# Patient Record
Sex: Male | Born: 1960 | Race: White | Hispanic: No | Marital: Married | Smoking: Former smoker
Health system: Southern US, Community
[De-identification: ages and names within clinical notes are randomized; demographics above are authoritative.]

## PROBLEM LIST (undated history)

## (undated) DIAGNOSIS — E7800 Pure hypercholesterolemia, unspecified: Secondary | ICD-10-CM

## (undated) DIAGNOSIS — T7840XA Allergy, unspecified, initial encounter: Secondary | ICD-10-CM

## (undated) DIAGNOSIS — L309 Dermatitis, unspecified: Secondary | ICD-10-CM

## (undated) DIAGNOSIS — H269 Unspecified cataract: Secondary | ICD-10-CM

## (undated) DIAGNOSIS — M1990 Unspecified osteoarthritis, unspecified site: Secondary | ICD-10-CM

## (undated) DIAGNOSIS — J302 Other seasonal allergic rhinitis: Secondary | ICD-10-CM

## (undated) HISTORY — PX: APPENDECTOMY: SHX54

## (undated) HISTORY — DX: Unspecified cataract: H26.9

## (undated) HISTORY — PX: TONSILLECTOMY: SUR1361

## (undated) HISTORY — DX: Allergy, unspecified, initial encounter: T78.40XA

## (undated) HISTORY — PX: LASIK: SHX215

## (undated) HISTORY — DX: Unspecified osteoarthritis, unspecified site: M19.90

## (undated) HISTORY — PX: SHOULDER SURGERY: SHX246

## (undated) HISTORY — PX: NECK SURGERY: SHX720

---

## 2000-07-16 ENCOUNTER — Encounter: Payer: Self-pay | Admitting: Family Medicine

## 2000-07-16 ENCOUNTER — Encounter: Admission: RE | Admit: 2000-07-16 | Discharge: 2000-07-16 | Payer: Self-pay | Admitting: Family Medicine

## 2002-12-28 ENCOUNTER — Encounter: Payer: Self-pay | Admitting: Family Medicine

## 2002-12-28 ENCOUNTER — Encounter: Admission: RE | Admit: 2002-12-28 | Discharge: 2002-12-28 | Payer: Self-pay | Admitting: Family Medicine

## 2003-02-02 ENCOUNTER — Encounter: Payer: Self-pay | Admitting: Family Medicine

## 2003-02-02 ENCOUNTER — Encounter: Admission: RE | Admit: 2003-02-02 | Discharge: 2003-02-02 | Payer: Self-pay | Admitting: Family Medicine

## 2009-05-12 HISTORY — PX: SHOULDER SURGERY: SHX246

## 2010-05-09 ENCOUNTER — Ambulatory Visit
Admission: RE | Admit: 2010-05-09 | Discharge: 2010-05-10 | Payer: Self-pay | Source: Home / Self Care | Attending: Orthopedic Surgery | Admitting: Orthopedic Surgery

## 2010-05-12 HISTORY — PX: NECK SURGERY: SHX720

## 2010-07-22 LAB — POCT HEMOGLOBIN-HEMACUE: Hemoglobin: 14.3 g/dL (ref 13.0–17.0)

## 2011-04-24 ENCOUNTER — Encounter: Payer: Self-pay | Admitting: *Deleted

## 2011-04-24 ENCOUNTER — Emergency Department (HOSPITAL_COMMUNITY)
Admission: EM | Admit: 2011-04-24 | Discharge: 2011-04-24 | Disposition: A | Discharge status: Home / Self Care | Payer: 59 | Attending: Emergency Medicine | Admitting: Emergency Medicine

## 2011-04-24 ENCOUNTER — Emergency Department (HOSPITAL_COMMUNITY): Payer: 59

## 2011-04-24 DIAGNOSIS — E7800 Pure hypercholesterolemia, unspecified: Secondary | ICD-10-CM | POA: Insufficient documentation

## 2011-04-24 DIAGNOSIS — R059 Cough, unspecified: Secondary | ICD-10-CM | POA: Insufficient documentation

## 2011-04-24 DIAGNOSIS — R509 Fever, unspecified: Secondary | ICD-10-CM | POA: Insufficient documentation

## 2011-04-24 DIAGNOSIS — R1310 Dysphagia, unspecified: Secondary | ICD-10-CM | POA: Insufficient documentation

## 2011-04-24 DIAGNOSIS — R05 Cough: Secondary | ICD-10-CM | POA: Insufficient documentation

## 2011-04-24 LAB — URINALYSIS, ROUTINE W REFLEX MICROSCOPIC
Bilirubin Urine: NEGATIVE
Ketones, ur: NEGATIVE mg/dL
Leukocytes, UA: NEGATIVE
Nitrite: NEGATIVE
Protein, ur: NEGATIVE mg/dL
Urobilinogen, UA: 0.2 mg/dL (ref 0.0–1.0)

## 2011-04-24 LAB — DIFFERENTIAL
Basophils Absolute: 0 10*3/uL (ref 0.0–0.1)
Basophils Relative: 0 % (ref 0–1)
Eosinophils Absolute: 0.1 10*3/uL (ref 0.0–0.7)
Eosinophils Relative: 1 % (ref 0–5)
Lymphocytes Relative: 12 % (ref 12–46)
Lymphs Abs: 1.8 10*3/uL (ref 0.7–4.0)
Monocytes Absolute: 1.7 10*3/uL — ABNORMAL HIGH (ref 0.1–1.0)
Monocytes Relative: 11 % (ref 3–12)
Neutro Abs: 11.9 10*3/uL — ABNORMAL HIGH (ref 1.7–7.7)
Neutrophils Relative %: 77 % (ref 43–77)

## 2011-04-24 LAB — CBC
HCT: 37.4 % — ABNORMAL LOW (ref 39.0–52.0)
Hemoglobin: 12.6 g/dL — ABNORMAL LOW (ref 13.0–17.0)
MCH: 30.7 pg (ref 26.0–34.0)
MCHC: 33.7 g/dL (ref 30.0–36.0)
MCV: 91.2 fL (ref 78.0–100.0)
Platelets: 239 10*3/uL (ref 150–400)
RBC: 4.1 MIL/uL — ABNORMAL LOW (ref 4.22–5.81)
RDW: 13.1 % (ref 11.5–15.5)
WBC: 15.5 10*3/uL — ABNORMAL HIGH (ref 4.0–10.5)

## 2011-04-24 NOTE — ED Provider Notes (Signed)
History     CSN: [id number] Arrival date & time: 04/24/2011  5:40 PM   None     Chief Complaint  Patient presents with  . Fever    (Consider location/radiation/quality/duration/timing/severity/associated sxs/prior treatment) HPI Complains of fever onset yesterday accompanied by pain on swallowing and cough maximum temperature 101.2 no treatment prior to coming here no other complaint. No bowel pain no nausea no vomit. Cough nonproductive. No other associated symptoms. Patient status post cervical fusion 2 days ago History reviewed. No pertinent past medical history. Hypercholesterolemia Past Surgical History  Procedure Date  . Neck surgery   . Shoulder surgery    Rotator cuff repair; anterior cervical fusion No family history on file.  History  Substance Use Topics  . Smoking status: Not on file  . Smokeless tobacco: Not on file  . Alcohol Use: Yes      Review of Systems  Constitutional: Positive for fever.  HENT: Negative for neck stiffness.        Pain with swallowing  Respiratory: Positive for cough.   Cardiovascular: Negative.   Gastrointestinal: Negative.   Musculoskeletal: Negative.   Skin: Negative.   Neurological: Negative.   Hematological: Negative.   Psychiatric/Behavioral: Negative.   All other systems reviewed and are negative.    Allergies  Review of patient's allergies indicates no known allergies.  Home Medications  No current outpatient prescriptions on file.  BP 124/80  Pulse 101  Temp(Src) 100.2 F (37.9 C) (Oral)  Resp 20  SpO2 96%  Physical Exam  Constitutional: He appears well-developed and well-nourished. No distress.  HENT:  Head: Normocephalic and atraumatic.       Oropharynx normal  Eyes: Conjunctivae are normal. Pupils are equal, round, and reactive to light.       A lateral tympanic membranes normal  Neck: Neck supple. No tracheal deviation present. No thyromegaly present.       Well-healing surgical wound anteriorly.  Animal soft tissue swelling no drainage no tenderness  Cardiovascular: Normal rate and regular rhythm.   No murmur heard. Pulmonary/Chest: Effort normal and breath sounds normal.  Abdominal: Soft. Bowel sounds are normal. He exhibits no distension. There is no tenderness.  Musculoskeletal: Normal range of motion. He exhibits no edema and no tenderness.  Neurological: He is alert. Coordination normal.  Skin: Skin is warm and dry. No rash noted.  Psychiatric: He has a normal mood and affect.    ED Course  Procedures (including critical care time)805 pm resting comfortably. No disteress Results for orders placed during the hospital encounter of 04/24/11  CBC      Component Value Range   WBC 15.5 (*) 4.0 - 10.5 (K/uL)   RBC 4.10 (*) 4.22 - 5.81 (MIL/uL)   Hemoglobin 12.6 (*) 13.0 - 17.0 (g/dL)   HCT 40.9 (*) 81.1 - 52.0 (%)   MCV 91.2  78.0 - 100.0 (fL)   MCH 30.7  26.0 - 34.0 (pg)   MCHC 33.7  30.0 - 36.0 (g/dL)   RDW 91.4  78.2 - 95.6 (%)   Platelets 239  150 - 400 (K/uL)  DIFFERENTIAL      Component Value Range   Neutrophils Relative 77  43 - 77 (%)   Neutro Abs 11.9 (*) 1.7 - 7.7 (K/uL)   Lymphocytes Relative 12  12 - 46 (%)   Lymphs Abs 1.8  0.7 - 4.0 (K/uL)   Monocytes Relative 11  3 - 12 (%)   Monocytes Absolute 1.7 (*) 0.1 - 1.0 (K/uL)  Eosinophils Relative 1  0 - 5 (%)   Eosinophils Absolute 0.1  0.0 - 0.7 (K/uL)   Basophils Relative 0  0 - 1 (%)   Basophils Absolute 0.0  0.0 - 0.1 (K/uL)  URINALYSIS, ROUTINE W REFLEX MICROSCOPIC      Component Value Range   Color, Urine YELLOW  YELLOW    APPearance CLEAR  CLEAR    Specific Gravity, Urine 1.018  1.005 - 1.030    pH 6.0  5.0 - 8.0    Glucose, UA NEGATIVE  NEGATIVE (mg/dL)   Hgb urine dipstick NEGATIVE  NEGATIVE    Bilirubin Urine NEGATIVE  NEGATIVE    Ketones, ur NEGATIVE  NEGATIVE (mg/dL)   Protein, ur NEGATIVE  NEGATIVE (mg/dL)   Urobilinogen, UA 0.2  0.0 - 1.0 (mg/dL)   Nitrite NEGATIVE  NEGATIVE     Leukocytes, UA NEGATIVE  NEGATIVE    Dg Chest 2 View  04/24/2011  *RADIOLOGY REPORT*  Clinical Data: Cough.  Status post cervical fusion yesterday. Fever.  CHEST - 2 VIEW  Comparison: None.  Findings: The heart size is normal.  The lungs are clear.  The patient is status post cervical fusion at C5-6 and C6-7.  The axial skeleton is otherwise unremarkable.  IMPRESSION:  1.  Normal two-view chest. 2.  Status post lower cervical fusion.  Original Report Authenticated By: Jamesetta Orleans. MATTERN, M.D.     Labs Reviewed  CBC  DIFFERENTIAL   No results found.   No diagnosis found.    MDM  Discussed with Dr.Nudelman. CT neck not indicated. Patient has minimal pain and tenderness at site. Landon and sent to spirometry. Tylenol followup Dr. Newell Coral next week Diagnosis: postoperative fever        Doug Sou, MD 04/24/11 2014

## 2011-04-24 NOTE — ED Notes (Signed)
MD at bedside. 

## 2011-04-24 NOTE — ED Notes (Signed)
Patient transported to X-ray 

## 2011-04-24 NOTE — ED Notes (Signed)
Pt had neck surgery by Dr. Bettina Gavia yesterday and he was told to come here if he had a fever over 101.  Pt began having a fever last night, it was 101 at 20:30 last night.  Pt was afebrile this morning and began having increasing temp this afternoon and evening.  Pt wife spoke with RN at Dr. Brendia Sacks office and was told to come here.  Pt incision appears WNL, no drainage.  Pt has some reports some difficulty swallowing, no distress, no sob.  Pt was told to come here and have a "fever panel" drawn.

## 2012-02-27 ENCOUNTER — Encounter: Payer: Self-pay | Admitting: Internal Medicine

## 2012-04-05 ENCOUNTER — Encounter: Payer: Self-pay | Admitting: Internal Medicine

## 2012-04-05 ENCOUNTER — Ambulatory Visit (AMBULATORY_SURGERY_CENTER): Payer: 59

## 2012-04-05 VITALS — Ht 70.0 in | Wt 175.0 lb

## 2012-04-05 DIAGNOSIS — Z1211 Encounter for screening for malignant neoplasm of colon: Secondary | ICD-10-CM

## 2012-04-05 MED ORDER — MOVIPREP 100 G PO SOLR: 1.0000 | Freq: Once | ORAL | Status: DC

## 2012-04-27 ENCOUNTER — Encounter: Payer: Self-pay | Admitting: Internal Medicine

## 2012-04-27 ENCOUNTER — Ambulatory Visit (AMBULATORY_SURGERY_CENTER): Payer: 59 | Admitting: Internal Medicine

## 2012-04-27 VITALS — BP 120/89 | HR 71 | Temp 98.0°F | Resp 15 | Ht 70.0 in | Wt 175.0 lb

## 2012-04-27 DIAGNOSIS — D126 Benign neoplasm of colon, unspecified: Secondary | ICD-10-CM

## 2012-04-27 DIAGNOSIS — Z1211 Encounter for screening for malignant neoplasm of colon: Secondary | ICD-10-CM

## 2012-04-27 MED ORDER — SODIUM CHLORIDE 0.9 % IV SOLN: 500.0000 mL | INTRAVENOUS | Status: DC

## 2012-04-27 NOTE — Progress Notes (Signed)
Called to room to assist during endoscopic procedure.  Patient ID and intended procedure confirmed with present staff. Received instructions for my participation in the procedure from the performing physician.  

## 2012-04-27 NOTE — Op Note (Signed)
[facility] [address] [place], [address]   COLONOSCOPY PROCEDURE REPORT  PATIENT: [name], [name]  MR#: [id number] BIRTHDATE: 01-Jul-1960 , 51  yrs. old GENDER: Male ENDOSCOPIST: Roxy Cedar, MD REFERRED XB:[id number] Avva, M.D. PROCEDURE DATE:  04/27/2012 PROCEDURE:   Colonoscopy with snare polypectomy   x 1 ASA CLASS:   Class II INDICATIONS:average risk screening. MEDICATIONS: MAC sedation, administered by CRNA and propofol (Diprivan) 350mg  IV  DESCRIPTION OF PROCEDURE:   After the risks benefits and alternatives of the procedure were thoroughly explained, informed consent was obtained.  A digital rectal exam revealed no abnormalities of the rectum.   The LB [id number]  endoscope was introduced through the anus and advanced to the cecum, which was identified by both the appendix and ileocecal valve. No adverse events experienced.   The quality of the prep was excellent, using MoviPrep  The instrument was then slowly withdrawn as the colon was fully examined.      COLON FINDINGS: A diminutive polyp was found in the transverse colon.  A polypectomy was performed with a cold snare.  The resection was complete and the polyp tissue was completely retrieved.   Moderate diverticulosis was noted in the descending colon and sigmoid colon.   The colon mucosa was otherwise normal. Retroflexed views revealed internal hemorrhoids. The time to cecum=2 minutes 34 seconds.  Withdrawal time=13 minutes 38 seconds. The scope was withdrawn and the procedure completed. COMPLICATIONS: There were no complications.  ENDOSCOPIC IMPRESSION: 1.   Diminutive polyp was found in the transverse colon; polypectomy was performed with a cold snare 2.   Moderate diverticulosis was noted in the descending colon and sigmoid colon 3.   The colon mucosa was otherwise normal  RECOMMENDATIONS: 1. Repeat colonoscopy in 5 years if polyp adenomatous; otherwise  10 years   eSigned:  Roxy Cedar, MD 04/27/2012 12:34 PM   cc: Chilton Greathouse, MD and The Patient   PATIENT NAME:  [name], [name] MR#: [id number]

## 2012-04-27 NOTE — Progress Notes (Signed)
Patient did not experience any of the following events: a burn prior to discharge; a fall within the facility; wrong site/side/patient/procedure/implant event; or a hospital transfer or hospital admission upon discharge from the facility. (G8907) Patient did not have preoperative order for IV antibiotic SSI prophylaxis. (G8918)  

## 2012-04-27 NOTE — Patient Instructions (Addendum)
Impressions/recommendations:  Polyp (handout given) Diverticulosis (handout given) High fiber diet (handout given)  Repeat colonoscopy pending pathology results.  YOU HAD AN ENDOSCOPIC PROCEDURE TODAY AT THE [facility]: Refer to the procedure report that was given to you for any specific questions about what was found during the examination.  If the procedure report does not answer your questions, please call your gastroenterologist to clarify.  If you requested that your care partner not be given the details of your procedure findings, then the procedure report has been included in a sealed envelope for you to review at your convenience later.  YOU SHOULD EXPECT: Some feelings of bloating in the abdomen. Passage of more gas than usual.  Walking can help get rid of the air that was put into your GI tract during the procedure and reduce the bloating. If you had a lower endoscopy (such as a colonoscopy or flexible sigmoidoscopy) you may notice spotting of blood in your stool or on the toilet paper. If you underwent a bowel prep for your procedure, then you may not have a normal bowel movement for a few days.  DIET: Your first meal following the procedure should be a light meal and then it is ok to progress to your normal diet.  A half-sandwich or bowl of soup is an example of a good first meal.  Heavy or fried foods are harder to digest and may make you feel nauseous or bloated.  Likewise meals heavy in dairy and vegetables can cause extra gas to form and this can also increase the bloating.  Drink plenty of fluids but you should avoid alcoholic beverages for 24 hours.  ACTIVITY: Your care partner should take you home directly after the procedure.  You should plan to take it easy, moving slowly for the rest of the day.  You can resume normal activity the day after the procedure however you should NOT DRIVE or use heavy machinery for 24 hours (because of the sedation medicines used during  the test).    SYMPTOMS TO REPORT IMMEDIATELY: A gastroenterologist can be reached at any hour.  During normal business hours, 8:30 AM to 5:00 PM Monday through Friday, call [phone].  After hours and on weekends, please call the GI answering service at [phone] who will take a message and have the physician on call contact you.   Following lower endoscopy (colonoscopy or flexible sigmoidoscopy):  Excessive amounts of blood in the stool  Significant tenderness or worsening of abdominal pains  Swelling of the abdomen that is new, acute  Fever of 100F or higher  FOLLOW UP: If any biopsies were taken you will be contacted by phone or by letter within the next 1-3 weeks.  Call your gastroenterologist if you have not heard about the biopsies in 3 weeks.  Our staff will call the home number listed on your records the next business day following your procedure to check on you and address any questions or concerns that you may have at that time regarding the information given to you following your procedure. This is a courtesy call and so if there is no answer at the home number and we have not heard from you through the emergency physician on call, we will assume that you have returned to your regular daily activities without incident.  SIGNATURES/CONFIDENTIALITY: You and/or your care partner have signed paperwork which will be entered into your electronic medical record.  These signatures attest to the fact that that the information   above on your After Visit Summary has been reviewed and is understood.  Full responsibility of the confidentiality of this discharge information lies with you and/or your care-partner. 

## 2012-04-28 ENCOUNTER — Telehealth: Payer: Self-pay

## 2012-04-28 NOTE — Telephone Encounter (Signed)
  Follow up Call-  Call back number 04/27/2012  Post procedure Call Back phone  # [phone]  Permission to leave phone message Yes     Patient questions:  Do you have a fever, pain , or abdominal swelling? no Pain Score  0 *  Have you tolerated food without any problems? yes  Have you been able to return to your normal activities? yes  Do you have any questions about your discharge instructions: Diet   no Medications  no Follow up visit  no  Do you have questions or concerns about your Care? no  Actions: * If pain score is 4 or above: No action needed, pain <4.

## 2012-05-03 ENCOUNTER — Encounter: Payer: Self-pay | Admitting: Internal Medicine

## 2020-03-07 ENCOUNTER — Ambulatory Visit: Payer: BC Managed Care – PPO | Admitting: Orthopedic Surgery

## 2020-03-07 ENCOUNTER — Ambulatory Visit: Payer: Self-pay

## 2020-03-07 DIAGNOSIS — M25511 Pain in right shoulder: Secondary | ICD-10-CM

## 2020-03-10 NOTE — Progress Notes (Signed)
Office Visit Note   Patient: Carlos Arnold           Date of Birth: [birth year]           MRN: [id number] Visit Date: 03/07/2020 Requested by: Prince Solian, MD [address] [place],  [place] [address] PCP: Prince Solian, MD  Subjective: Chief Complaint  Patient presents with  . Right Shoulder - Pain    HPI: Carlos Arnold is a 59 y.o. male who presents to the office complaining of right shoulder pain for the last year.  Patient denies any acute injury.  He complains of anterior pain without radiation.  Pain is worse with lifting.  He has tried Aleve and Advil without significant relief.  He had a left shoulder rotator cuff repair 10 years ago.  Notes his symptoms today feel similar to this.  He has difficulty lifting the arm at times.  He has had to stop lifting weights in the last month and since then pain has been improving but he is afraid the pain will return when he goes back to his activities.  He wakes with pain on occasion.  He notes no neck pain, radicular pain, significant numbness/tingling.  He has history of prior neck surgery about 9 years ago.  He notes that he is very active and recently when he went fly fishing several weeks ago flicking his arm from side to side severely exacerbated his pain for couple days before improving with rest..                ROS: All systems reviewed are negative as they relate to the chief complaint within the history of present illness.  Patient denies fevers or chills.  Assessment & Plan: Visit Diagnoses:  1. Right shoulder pain, unspecified chronicity     Plan: Patient is a 59 year old male presents complaint of right shoulder pain.  He has had right shoulder pain for 1 year without injury.  He has difficulty lifting his arm at times as well as pain that is exacerbated with activity.  He has stopped lifting weights over the last month and pain is improved.  No frank weakness on exam but he does have some anterior crepitus that is  suspicious for rotator cuff injury.  Excellent range of motion.  Discussed options available to patient.  He wishes to hold off on any treatment for now and see how his pain changes as he works back into his activity level.  If his pain significantly worsens back to how it was several weeks to months ago, he will call the office and we will arrange for MRI arthrogram of the right shoulder for further evaluation.  Radiographs taken today of the right shoulder are negative for any acute pathology to explain his pain.  Follow-up as needed.  Follow-Up Instructions: No follow-ups on file.   Orders:  Orders Placed This Encounter  Procedures  . XR Shoulder Right   No orders of the defined types were placed in this encounter.     Procedures: No procedures performed   Clinical Data: No additional findings.  Objective: Vital Signs: There were no vitals taken for this visit.  Physical Exam:  Constitutional: Patient appears well-developed HEENT:  Head: Normocephalic Eyes:EOM are normal Neck: Normal range of motion Cardiovascular: Normal rate Pulmonary/chest: Effort normal Neurologic: Patient is alert Skin: Skin is warm Psychiatric: Patient has normal mood and affect  Ortho Exam: Ortho exam demonstrates right shoulder with 70 degrees external rotation, 115 degrees abduction,  160 degrees forward flexion.  Positive O'Brien sign.  Excellent strength of rotator cuff.  Anterior crepitus is felt with passive range of motion shoulder.  5/5 motor strength of the bilateral grip strength, finger abduction, pronation/supination, bicep, tricep, deltoid. Sensation intact through all dermatomes of bilateral upper extremities.  No tenderness over the axial cervical spine.  No pain with cervical spine range of motion.  Specialty Comments:  No specialty comments available.  Imaging: No results found.   PMFS History: There are no problems to display for this patient.  No past medical history on  file.  Family History  Problem Relation Age of Onset  . Colon cancer Neg Hx     Past Surgical History:  Procedure Laterality Date  . NECK SURGERY    . SHOULDER SURGERY     Social History   Occupational History  . Not on file  Tobacco Use  . Smoking status: Former Smoker    Quit date: 04/05/1982    Years since quitting: 37.9  . Smokeless tobacco: Never Used  Substance and Sexual Activity  . Alcohol use: Yes    Alcohol/week: 3.0 standard drinks    Types: 3 Glasses of wine per week  . Drug use: No  . Sexual activity: Not on file

## 2020-03-11 ENCOUNTER — Encounter: Payer: Self-pay | Admitting: Orthopedic Surgery

## 2021-04-03 ENCOUNTER — Other Ambulatory Visit: Payer: Self-pay | Admitting: Internal Medicine

## 2021-04-03 DIAGNOSIS — M5412 Radiculopathy, cervical region: Secondary | ICD-10-CM

## 2021-04-03 DIAGNOSIS — M25512 Pain in left shoulder: Secondary | ICD-10-CM

## 2021-05-16 ENCOUNTER — Ambulatory Visit
Admission: RE | Admit: 2021-05-16 | Discharge: 2021-05-16 | Disposition: A | Discharge status: Home / Self Care | Payer: BC Managed Care – PPO | Source: Ambulatory Visit | Attending: Internal Medicine | Admitting: Internal Medicine

## 2021-05-16 ENCOUNTER — Other Ambulatory Visit: Payer: Self-pay

## 2021-05-16 DIAGNOSIS — M25512 Pain in left shoulder: Secondary | ICD-10-CM

## 2021-05-16 DIAGNOSIS — M5412 Radiculopathy, cervical region: Secondary | ICD-10-CM

## 2021-05-16 MED ORDER — GADOBENATE DIMEGLUMINE 529 MG/ML IV SOLN
15.0000 mL | Freq: Once | INTRAVENOUS | Status: AC | PRN
  Administered 2021-05-16: 15 mL via INTRAVENOUS

## 2021-07-29 ENCOUNTER — Other Ambulatory Visit: Payer: Self-pay | Admitting: Internal Medicine

## 2021-07-29 DIAGNOSIS — R748 Abnormal levels of other serum enzymes: Secondary | ICD-10-CM

## 2021-08-21 ENCOUNTER — Ambulatory Visit
Admission: RE | Admit: 2021-08-21 | Discharge: 2021-08-21 | Disposition: A | Discharge status: Home / Self Care | Payer: BC Managed Care – PPO | Source: Ambulatory Visit | Attending: Internal Medicine | Admitting: Internal Medicine

## 2021-08-21 DIAGNOSIS — R748 Abnormal levels of other serum enzymes: Secondary | ICD-10-CM

## 2022-03-07 ENCOUNTER — Encounter: Payer: Self-pay | Admitting: Internal Medicine

## 2022-03-12 ENCOUNTER — Ambulatory Visit (INDEPENDENT_AMBULATORY_CARE_PROVIDER_SITE_OTHER): Payer: BC Managed Care – PPO

## 2022-03-12 ENCOUNTER — Encounter: Payer: Self-pay | Admitting: Orthopedic Surgery

## 2022-03-12 ENCOUNTER — Ambulatory Visit: Payer: BC Managed Care – PPO | Admitting: Orthopedic Surgery

## 2022-03-12 DIAGNOSIS — M25511 Pain in right shoulder: Secondary | ICD-10-CM

## 2022-03-12 NOTE — Progress Notes (Cosign Needed Addendum)
Office Visit Note   Patient: Carlos Arnold           Date of Birth: 03/02/1961           MRN: [id number] Visit Date: 03/12/2022 Requested by: Prince Solian, MD [address] [place],   [address] PCP: Prince Solian, MD  Subjective: Chief Complaint  Patient presents with   Right Shoulder - Pain    HPI: Carlos Arnold is a 61 y.o. male who presents to the office reporting right shoulder pain.  Patient states he has had intermittent right shoulder pain for several years on and off but has been more consistent for the last year.  Localizes pain to the anterior aspect of the shoulder without radiation.  He states that is progressed to the point where he has had to stop working out over the last several months due to the increased pain.  Mostly has pain with overhead dumbbell press, dumbbell flies, push-ups.  Pain will not really wake him from deep sleep but makes it difficult falling asleep sometimes.  He has difficulty laying on his right shoulder when the pain flares up.  Has a slight noise with range of motion of the shoulder.  No history of prior right shoulder surgery.  Does have history of left rotator cuff tear repair in 2011.  Also has history of cervical spine surgery by Dr. Sherwood Gambler in 2012.  Sees Dr. Ellene Route who feels that his neck pain is related to the left shoulder pain he has but not related to the right shoulder pain.  No history of diabetes, smoking, blood thinner use.  Taking Advil daily every morning.  No scapular pain.  He is planning on having C-spine ESI set up by Dr. Ellene Route..                ROS: All systems reviewed are negative as they relate to the chief complaint within the history of present illness.  Patient denies fevers or chills.  Assessment & Plan: Visit Diagnoses:  1. Right shoulder pain, unspecified chronicity     Plan: Patient is a 61 year old male who presents for evaluation of right shoulder pain.  He has had shoulder pain for several years  with last office visit in 2021.  He has persistent pain intermittently since then.  With asymmetric subscap weakness and Popeye deformity, new compared to last visit in 2021, ultrasound was applied to the shoulder today demonstrating no bicep tendon noted in the bicipital groove and at least partial thickness tearing of the supraspinatus noted with hypoechoic signal in the tendon.  With chronic bicep tendon rupture and identifiable signal changes of the rotator cuff tendon on ultrasound today, need MRI arthrogram of the right shoulder for further evaluation of the extent of rotator cuff damage.  Follow-up after MRI to review results.  Radiographs of the right shoulder were taken today demonstrated no acute abnormality.  Follow-Up Instructions: No follow-ups on file.   Orders:  Orders Placed This Encounter  Procedures   XR Shoulder Right   MR SHOULDER RIGHT W CONTRAST   Arthrogram   No orders of the defined types were placed in this encounter.     Procedures: No procedures performed   Clinical Data: No additional findings.  Objective: Vital Signs: There were no vitals taken for this visit.  Physical Exam:  Constitutional: Patient appears well-developed HEENT:  Head: Normocephalic Eyes:EOM are normal Neck: Normal range of motion Cardiovascular: Normal rate Pulmonary/chest: Effort normal Neurologic: Patient is alert Skin:  Skin is warm Psychiatric: Patient has normal mood and affect  Ortho Exam: Ortho exam demonstrates right shoulder with 60 degrees external rotation, 100 degrees abduction, 160 degrees forward flexion.  This compared with left shoulder with 40 degrees external rotation, 110 degrees abduction, 170 degrees forward flexion.  He has 5/5 motor strength of supraspinatus and infraspinatus of the right shoulder with 5 -/5 subscapularis strength.  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  No tenderness over  the [facility] joint.  There is crepitus noted with passive motion of the right shoulder and 90 degrees of abduction that is mostly localized to the anterior and anterior lateral aspect of the right shoulder.  Axillary nerve intact with deltoid firing.  Intact EPL, FPL, finger abduction, finger adduction, pronation/supination, bicep, tricep, deltoid.  Positive liftoff sign on right, negative on left.  Negative belly press sign bilaterally.  Negative bearhug sign but does reproduce pain on the right.  Specialty Comments:  No specialty comments available.  Imaging: No results found.   PMFS History: There are no problems to display for this patient.  No past medical history on file.  Family History  Problem Relation Age of Onset   Colon cancer Neg Hx     Past Surgical History:  Procedure Laterality Date   NECK SURGERY     SHOULDER SURGERY     Social History   Occupational History   Not on file  Tobacco Use   Smoking status: Former    Types: Cigarettes    Quit date: 04/05/1982    Years since quitting: 39.9   Smokeless tobacco: Never  Substance and Sexual Activity   Alcohol use: Yes    Alcohol/week: 3.0 standard drinks of alcohol    Types: 3 Glasses of wine per week   Drug use: No   Sexual activity: Not on file

## 2022-03-13 ENCOUNTER — Encounter: Payer: Self-pay | Admitting: Internal Medicine

## 2022-04-02 ENCOUNTER — Ambulatory Visit
Admission: RE | Admit: 2022-04-02 | Discharge: 2022-04-02 | Disposition: A | Discharge status: Home / Self Care | Payer: BC Managed Care – PPO | Source: Ambulatory Visit | Attending: Orthopedic Surgery | Admitting: Orthopedic Surgery

## 2022-04-02 DIAGNOSIS — M25511 Pain in right shoulder: Secondary | ICD-10-CM

## 2022-04-02 MED ORDER — IOPAMIDOL (ISOVUE-M 200) INJECTION 41%
12.0000 mL | Freq: Once | INTRAMUSCULAR | Status: AC
  Administered 2022-04-02: 12 mL via INTRA_ARTICULAR

## 2022-04-07 ENCOUNTER — Encounter: Payer: Self-pay | Admitting: Orthopedic Surgery

## 2022-04-07 ENCOUNTER — Ambulatory Visit: Payer: BC Managed Care – PPO | Admitting: Orthopedic Surgery

## 2022-04-07 DIAGNOSIS — M75121 Complete rotator cuff tear or rupture of right shoulder, not specified as traumatic: Secondary | ICD-10-CM

## 2022-04-07 NOTE — Progress Notes (Unsigned)
   Office Visit Note   Patient: Carlos Arnold           Date of Birth: 20-Nov-1960           MRN: [id number] Visit Date: 04/07/2022 Requested by: Prince Solian, MD [address] [place],  [place] [address] PCP: Prince Solian, MD  Subjective: Chief Complaint  Patient presents with  . Right Shoulder - Pain, Follow-up    MRI review    HPI: Carlos Arnold is a 61 y.o. male who presents to the office reporting ***.                ROS: All systems reviewed are negative as they relate to the chief complaint within the history of present illness.  Patient denies fevers or chills.  Assessment & Plan: Visit Diagnoses: No diagnosis found.  Plan: ***  Follow-Up Instructions: No follow-ups on file.   Orders:  No orders of the defined types were placed in this encounter.  No orders of the defined types were placed in this encounter.     Procedures: No procedures performed   Clinical Data: No additional findings.  Objective: Vital Signs: There were no vitals taken for this visit.  Physical Exam:  Constitutional: Patient appears well-developed HEENT:  Head: Normocephalic Eyes:EOM are normal Neck: Normal range of motion Cardiovascular: Normal rate Pulmonary/chest: Effort normal Neurologic: Patient is alert Skin: Skin is warm Psychiatric: Patient has normal mood and affect  Ortho Exam: ***  Specialty Comments:  No specialty comments available.  Imaging: No results found.   PMFS History: There are no problems to display for this patient.  History reviewed. No pertinent past medical history.  Family History  Problem Relation Age of Onset  . Colon cancer Neg Hx     Past Surgical History:  Procedure Laterality Date  . NECK SURGERY    . SHOULDER SURGERY     Social History   Occupational History  . Not on file  Tobacco Use  . Smoking status: Former    Types: Cigarettes    Quit date: 04/05/1982    Years since quitting: 40.0  . Smokeless tobacco:  Never  Substance and Sexual Activity  . Alcohol use: Yes    Alcohol/week: 3.0 standard drinks of alcohol    Types: 3 Glasses of wine per week  . Drug use: No  . Sexual activity: Not on file

## 2022-04-08 ENCOUNTER — Encounter: Payer: Self-pay | Admitting: Orthopedic Surgery

## 2022-04-25 ENCOUNTER — Other Ambulatory Visit (HOSPITAL_COMMUNITY): Payer: BC Managed Care – PPO

## 2022-04-25 NOTE — Progress Notes (Signed)
Surgical Instructions    Your procedure is scheduled on Tuesday, 04/29/22.  Report to [facility] Main Entrance "A" at 4:00 P.M., then check in with the Admitting office.  Call this number if you have problems the morning of surgery:  [phone]   If you have any questions prior to your surgery date call [phone]: Open Monday-Friday 8am-4pm If you experience any cold or flu symptoms such as cough, fever, chills, shortness of breath, etc. between now and your scheduled surgery, please notify us at the above number     Remember:  Do not eat after midnight the night before your surgery  You may drink clear liquids until 3:00PM the day of your surgery.   Clear liquids allowed are: Water, Non-Citrus Juices (without pulp), Carbonated Beverages, Clear Tea, Black Coffee ONLY (NO MILK, CREAM OR POWDERED CREAMER of any kind), and Gatorade    Take these medicines the morning of surgery with A SIP OF WATER:  atorvastatin (LIPITOR)  fexofenadine (ALLEGRA)   As of today, STOP taking any Aspirin (unless otherwise instructed by your surgeon) Aleve, Naproxen, Ibuprofen, Motrin, Advil, Goody's, BC's, all herbal medications, fish oil, and all vitamins.           Do not wear jewelry or makeup. Do not wear lotions, powders, cologne or deodorant. Men may shave face and neck. Do not bring valuables to the hospital. Do not wear nail polish, gel polish, artificial nails, or any other type of covering on natural nails (fingers and toes) If you have artificial nails or gel coating that need to be removed by a nail salon, please have this removed prior to surgery. Artificial nails or gel coating may interfere with anesthesia's ability to adequately monitor your vital signs.  [facility] is not responsible for any belongings or valuables.    Do NOT Smoke (Tobacco/Vaping)  24 hours prior to your procedure  If you use a CPAP at night, you may bring your mask for your overnight stay.   Contacts, glasses,  hearing aids, dentures or partials may not be worn into surgery, please bring cases for these belongings   For patients admitted to the hospital, discharge time will be determined by your treatment team.   Patients discharged the day of surgery will not be allowed to drive home, and someone needs to stay with them for 24 hours.   SURGICAL WAITING ROOM VISITATION Patients having surgery or a procedure may have no more than 2 support people in the waiting area - these visitors may rotate.   Children under the age of 26 must have an adult with them who is not the patient. If the patient needs to stay at the hospital during part of their recovery, the visitor guidelines for inpatient rooms apply. Pre-op nurse will coordinate an appropriate time for 1 support person to accompany patient in pre-op.  This support person may not rotate.   Please refer to [email or web address] for the visitor guidelines for Inpatients (after your surgery is over and you are in a regular room).    Special instructions:    Oral Hygiene is also important to reduce your risk of infection.  Remember - BRUSH YOUR TEETH THE MORNING OF SURGERY WITH YOUR REGULAR TOOTHPASTE   [facility]- Preparing For Surgery  Before surgery, you can play an important role. Because skin is not sterile, your skin needs to be as free of germs as possible. You can reduce the number of germs on your skin by washing with CHG (chlorahexidine  gluconate) Soap before surgery.  CHG is an antiseptic cleaner which kills germs and bonds with the skin to continue killing germs even after washing.     Please do not use if you have an allergy to CHG or antibacterial soaps. If your skin becomes reddened/irritated stop using the CHG.  Do not shave (including legs and underarms) for at least 48 hours prior to first CHG shower. It is OK to shave your face.  Please follow these instructions carefully.      Shower the NIGHT BEFORE SURGERY and the MORNING OF SURGERY with CHG Soap.   If you chose to wash your hair, wash your hair first as usual with your normal shampoo. After you shampoo, rinse your hair and body thoroughly to remove the shampoo.  Then [employer] and genitals (private parts) with your normal soap and rinse thoroughly to remove soap.  After that Use CHG Soap as you would any other liquid soap. You can apply CHG directly to the skin and wash gently with a scrungie or a clean washcloth.   Apply the CHG Soap to your body ONLY FROM THE NECK DOWN.  Do not use on open wounds or open sores. Avoid contact with your eyes, ears, mouth and genitals (private parts). Wash Face and genitals (private parts)  with your normal soap.   Wash thoroughly, paying special attention to the area where your surgery will be performed.  Thoroughly rinse your body with warm water from the neck down.  DO NOT shower/wash with your normal soap after using and rinsing off the CHG Soap.  Pat yourself dry with a CLEAN TOWEL.  Wear CLEAN PAJAMAS to bed the night before surgery  Place CLEAN SHEETS on your bed the night before your surgery  DO NOT SLEEP WITH PETS.   Day of Surgery: Take a shower with CHG soap. Wear Clean/Comfortable clothing the morning of surgery Do not apply any deodorants/lotions.   Remember to brush your teeth WITH YOUR REGULAR TOOTHPASTE.    If you received a COVID test during your pre-op visit, it is requested that you wear a mask when out in public, stay away from anyone that may not be feeling well, and notify your surgeon if you develop symptoms. If you have been in contact with anyone that has tested positive in the last 10 days, please notify your surgeon.    Please read over the following fact sheets that you were given.

## 2022-04-28 ENCOUNTER — Encounter (HOSPITAL_COMMUNITY): Payer: Self-pay

## 2022-04-28 ENCOUNTER — Encounter (HOSPITAL_COMMUNITY)
Admission: RE | Admit: 2022-04-28 | Discharge: 2022-04-28 | Disposition: A | Discharge status: Home / Self Care | Payer: BC Managed Care – PPO | Source: Ambulatory Visit | Attending: Orthopedic Surgery | Admitting: Orthopedic Surgery

## 2022-04-28 ENCOUNTER — Other Ambulatory Visit: Payer: Self-pay

## 2022-04-28 DIAGNOSIS — Z87891 Personal history of nicotine dependence: Secondary | ICD-10-CM | POA: Diagnosis not present

## 2022-04-28 DIAGNOSIS — M2550 Pain in unspecified joint: Secondary | ICD-10-CM | POA: Diagnosis not present

## 2022-04-28 DIAGNOSIS — Z791 Long term (current) use of non-steroidal anti-inflammatories (NSAID): Secondary | ICD-10-CM | POA: Diagnosis not present

## 2022-04-28 DIAGNOSIS — Z01812 Encounter for preprocedural laboratory examination: Secondary | ICD-10-CM | POA: Insufficient documentation

## 2022-04-28 DIAGNOSIS — Z01818 Encounter for other preprocedural examination: Secondary | ICD-10-CM

## 2022-04-28 DIAGNOSIS — M75121 Complete rotator cuff tear or rupture of right shoulder, not specified as traumatic: Secondary | ICD-10-CM | POA: Diagnosis not present

## 2022-04-28 HISTORY — DX: Other seasonal allergic rhinitis: J30.2

## 2022-04-28 HISTORY — DX: Dermatitis, unspecified: L30.9

## 2022-04-28 HISTORY — DX: Pure hypercholesterolemia, unspecified: E78.00

## 2022-04-28 LAB — CBC
HCT: 39.5 % (ref 39.0–52.0)
Hemoglobin: 13.8 g/dL (ref 13.0–17.0)
MCH: 32.4 pg (ref 26.0–34.0)
MCHC: 34.9 g/dL (ref 30.0–36.0)
MCV: 92.7 fL (ref 80.0–100.0)
Platelets: 233 10*3/uL (ref 150–400)
RBC: 4.26 MIL/uL (ref 4.22–5.81)
RDW: 13.6 % (ref 11.5–15.5)
WBC: 6.4 10*3/uL (ref 4.0–10.5)
nRBC: 0 % (ref 0.0–0.2)

## 2022-04-28 LAB — BASIC METABOLIC PANEL
Anion gap: 7 (ref 5–15)
BUN: 16 mg/dL (ref 8–23)
CO2: 28 mmol/L (ref 22–32)
Calcium: 9.4 mg/dL (ref 8.9–10.3)
Chloride: 103 mmol/L (ref 98–111)
Creatinine, Ser: 0.98 mg/dL (ref 0.61–1.24)
GFR, Estimated: >60 mL/min (ref >60)
Glucose, Bld: 114 mg/dL — ABNORMAL HIGH (ref 70–99)
Potassium: 4.6 mmol/L (ref 3.5–5.1)
Sodium: 138 mmol/L (ref 135–145)

## 2022-04-28 NOTE — Progress Notes (Addendum)
  PCP - Dr.Ravisankar Avva Cardiologist - pt denies  PPM/ICD - pt denies Device Orders - n/a Rep Notified - n/a  Chest x-ray - n/a EKG - n/a Stress Test - 10-15 years ago, normal per pt ECHO - pt denies Cardiac Cath - pt denies  Sleep Study - pt denies CPAP - n/a  Fasting Blood Sugar - pt denies Diabetes  Checks Blood Sugar _____ times a day  Last dose of GLP1 agonist-  pt denies GLP1 instructions: n/a  Blood Thinner Instructions: pt denies Aspirin Instructions:pt deneis  ERAS Protcol -yes  PRE-SURGERY Ensure given at PAT.   COVID TEST- n/a   Anesthesia review: NO   Patient denies shortness of breath, fever, cough and chest pain at PAT appointment   All instructions explained to the patient, with a verbal understanding of the material. Patient agrees to go over the instructions while at home for a better understanding. Patient also instructed to self quarantine after being tested for COVID-19. The opportunity to ask questions was provided.   Ensure pre-surgery drink given to patient and instructions on when to consume went over with pt.

## 2022-04-29 ENCOUNTER — Ambulatory Visit (HOSPITAL_COMMUNITY): Payer: BC Managed Care – PPO | Admitting: Anesthesiology

## 2022-04-29 ENCOUNTER — Other Ambulatory Visit: Payer: Self-pay

## 2022-04-29 ENCOUNTER — Encounter (HOSPITAL_COMMUNITY)
Admission: RE | Disposition: A | Discharge status: Home / Self Care | Payer: Self-pay | Source: Ambulatory Visit | Attending: Orthopedic Surgery

## 2022-04-29 ENCOUNTER — Ambulatory Visit (HOSPITAL_COMMUNITY)
Admission: RE | Admit: 2022-04-29 | Discharge: 2022-04-29 | Disposition: A | Discharge status: Home / Self Care | Payer: BC Managed Care – PPO | Source: Ambulatory Visit | Attending: Orthopedic Surgery | Admitting: Orthopedic Surgery

## 2022-04-29 ENCOUNTER — Encounter (HOSPITAL_COMMUNITY): Payer: Self-pay | Admitting: Orthopedic Surgery

## 2022-04-29 DIAGNOSIS — M75121 Complete rotator cuff tear or rupture of right shoulder, not specified as traumatic: Secondary | ICD-10-CM | POA: Diagnosis not present

## 2022-04-29 DIAGNOSIS — M2550 Pain in unspecified joint: Secondary | ICD-10-CM | POA: Insufficient documentation

## 2022-04-29 DIAGNOSIS — M65811 Other synovitis and tenosynovitis, right shoulder: Secondary | ICD-10-CM

## 2022-04-29 DIAGNOSIS — Z87891 Personal history of nicotine dependence: Secondary | ICD-10-CM | POA: Insufficient documentation

## 2022-04-29 DIAGNOSIS — Z01818 Encounter for other preprocedural examination: Secondary | ICD-10-CM

## 2022-04-29 DIAGNOSIS — M65911 Unspecified synovitis and tenosynovitis, right shoulder: Secondary | ICD-10-CM

## 2022-04-29 DIAGNOSIS — Z791 Long term (current) use of non-steroidal anti-inflammatories (NSAID): Secondary | ICD-10-CM | POA: Insufficient documentation

## 2022-04-29 HISTORY — PX: SHOULDER ARTHROSCOPY WITH OPEN ROTATOR CUFF REPAIR: SHX6092

## 2022-04-29 SURGERY — ARTHROSCOPY, SHOULDER WITH REPAIR, ROTATOR CUFF, OPEN
Anesthesia: General | Site: Shoulder | Laterality: Right

## 2022-04-29 MED ORDER — PHENYLEPHRINE HCL-NACL 20-0.9 MG/250ML-% IV SOLN
INTRAVENOUS | Status: DC | PRN
  Administered 2022-04-29: 25 ug/min via INTRAVENOUS

## 2022-04-29 MED ORDER — METHOCARBAMOL 500 MG PO TABS: 500.0000 mg | ORAL_TABLET | Freq: Three times a day (TID) | ORAL | 1 refills | Status: DC | PRN

## 2022-04-29 MED ORDER — ORAL CARE MOUTH RINSE: 15.0000 mL | Freq: Once | OROMUCOSAL | Status: AC

## 2022-04-29 MED ORDER — FENTANYL CITRATE (PF) 100 MCG/2ML IJ SOLN
INTRAMUSCULAR | Status: AC
  Administered 2022-04-29: 100 ug via INTRAVENOUS
  Filled 2022-04-29: qty 2

## 2022-04-29 MED ORDER — LIDOCAINE 2% (20 MG/ML) 5 ML SYRINGE
INTRAMUSCULAR | Status: DC | PRN
  Administered 2022-04-29: 60 mg via INTRAVENOUS

## 2022-04-29 MED ORDER — TRANEXAMIC ACID-NACL 1000-0.7 MG/100ML-% IV SOLN
1000.0000 mg | INTRAVENOUS | Status: AC
  Administered 2022-04-29: 1000 mg via INTRAVENOUS
  Filled 2022-04-29: qty 100

## 2022-04-29 MED ORDER — FENTANYL CITRATE (PF) 100 MCG/2ML IJ SOLN: 100.0000 ug | Freq: Once | INTRAMUSCULAR | Status: AC

## 2022-04-29 MED ORDER — ROCURONIUM BROMIDE 10 MG/ML (PF) SYRINGE
PREFILLED_SYRINGE | INTRAVENOUS | Status: DC | PRN
  Administered 2022-04-29: 60 mg via INTRAVENOUS

## 2022-04-29 MED ORDER — FENTANYL CITRATE (PF) 250 MCG/5ML IJ SOLN
INTRAMUSCULAR | Status: DC | PRN
  Administered 2022-04-29: 25 ug via INTRAVENOUS
  Administered 2022-04-29: 100 ug via INTRAVENOUS

## 2022-04-29 MED ORDER — SODIUM CHLORIDE 0.9 % IR SOLN
Status: DC | PRN
  Administered 2022-04-29: 6000 mL

## 2022-04-29 MED ORDER — EPHEDRINE 5 MG/ML INJ
INTRAVENOUS | Status: AC
  Filled 2022-04-29: qty 5

## 2022-04-29 MED ORDER — EPINEPHRINE PF 1 MG/ML IJ SOLN
INTRAMUSCULAR | Status: DC | PRN
  Administered 2022-04-29: .15 mg via INTRAMUSCULAR

## 2022-04-29 MED ORDER — BUPIVACAINE-EPINEPHRINE (PF) 0.5% -1:200000 IJ SOLN
INTRAMUSCULAR | Status: DC | PRN
  Administered 2022-04-29: 15 mL via PERINEURAL

## 2022-04-29 MED ORDER — VANCOMYCIN HCL 1000 MG IV SOLR
INTRAVENOUS | Status: DC | PRN
  Administered 2022-04-29: 1000 mg via TOPICAL

## 2022-04-29 MED ORDER — OXYCODONE-ACETAMINOPHEN 5-325 MG PO TABS: 1.0000 | ORAL_TABLET | ORAL | 0 refills | Status: DC | PRN

## 2022-04-29 MED ORDER — HYDROMORPHONE HCL 1 MG/ML IJ SOLN: 0.2500 mg | INTRAMUSCULAR | Status: DC | PRN

## 2022-04-29 MED ORDER — LACTATED RINGERS IV SOLN: INTRAVENOUS | Status: DC

## 2022-04-29 MED ORDER — CEFAZOLIN SODIUM-DEXTROSE 2-4 GM/100ML-% IV SOLN
2.0000 g | INTRAVENOUS | Status: AC
  Administered 2022-04-29: 2 g via INTRAVENOUS
  Filled 2022-04-29: qty 100

## 2022-04-29 MED ORDER — MIDAZOLAM HCL 2 MG/2ML IJ SOLN
INTRAMUSCULAR | Status: AC
  Administered 2022-04-29: 2 mg via INTRAVENOUS
  Filled 2022-04-29: qty 2

## 2022-04-29 MED ORDER — AMISULPRIDE (ANTIEMETIC) 5 MG/2ML IV SOLN
INTRAVENOUS | Status: AC
  Filled 2022-04-29: qty 4

## 2022-04-29 MED ORDER — CHLORHEXIDINE GLUCONATE 0.12 % MT SOLN
15.0000 mL | Freq: Once | OROMUCOSAL | Status: AC
  Administered 2022-04-29: 15 mL via OROMUCOSAL
  Filled 2022-04-29: qty 15

## 2022-04-29 MED ORDER — SUGAMMADEX SODIUM 200 MG/2ML IV SOLN
INTRAVENOUS | Status: DC | PRN
  Administered 2022-04-29: 150 mg via INTRAVENOUS

## 2022-04-29 MED ORDER — PHENYLEPHRINE 80 MCG/ML (10ML) SYRINGE FOR IV PUSH (FOR BLOOD PRESSURE SUPPORT)
PREFILLED_SYRINGE | INTRAVENOUS | Status: DC | PRN
  Administered 2022-04-29: 80 ug via INTRAVENOUS

## 2022-04-29 MED ORDER — PROPOFOL 10 MG/ML IV BOLUS
INTRAVENOUS | Status: DC | PRN
  Administered 2022-04-29: 150 mg via INTRAVENOUS

## 2022-04-29 MED ORDER — SODIUM CHLORIDE (PF) 0.9 % IJ SOLN
INTRAMUSCULAR | Status: DC | PRN
  Administered 2022-04-29: 15 mL via INTRAVENOUS

## 2022-04-29 MED ORDER — VANCOMYCIN HCL 1000 MG IV SOLR
INTRAVENOUS | Status: AC
  Filled 2022-04-29: qty 20

## 2022-04-29 MED ORDER — MIDAZOLAM HCL 2 MG/2ML IJ SOLN: 2.0000 mg | Freq: Once | INTRAMUSCULAR | Status: AC

## 2022-04-29 MED ORDER — DEXAMETHASONE SODIUM PHOSPHATE 10 MG/ML IJ SOLN
INTRAMUSCULAR | Status: AC
  Filled 2022-04-29: qty 1

## 2022-04-29 MED ORDER — DEXAMETHASONE SODIUM PHOSPHATE 10 MG/ML IJ SOLN
INTRAMUSCULAR | Status: DC | PRN
  Administered 2022-04-29: 10 mg via INTRAVENOUS

## 2022-04-29 MED ORDER — BUPIVACAINE LIPOSOME 1.3 % IJ SUSP
INTRAMUSCULAR | Status: DC | PRN
  Administered 2022-04-29: 10 mL via PERINEURAL

## 2022-04-29 MED ORDER — SODIUM CHLORIDE 0.9 % IR SOLN
Status: DC | PRN
  Administered 2022-04-29: 1000 mL

## 2022-04-29 MED ORDER — PHENYLEPHRINE 80 MCG/ML (10ML) SYRINGE FOR IV PUSH (FOR BLOOD PRESSURE SUPPORT)
PREFILLED_SYRINGE | INTRAVENOUS | Status: AC
  Filled 2022-04-29: qty 10

## 2022-04-29 MED ORDER — ONDANSETRON HCL 4 MG/2ML IJ SOLN
INTRAMUSCULAR | Status: DC | PRN
  Administered 2022-04-29: 4 mg via INTRAVENOUS

## 2022-04-29 MED ORDER — ONDANSETRON HCL 4 MG/2ML IJ SOLN
INTRAMUSCULAR | Status: AC
  Filled 2022-04-29: qty 2

## 2022-04-29 MED ORDER — EPHEDRINE SULFATE (PRESSORS) 50 MG/ML IJ SOLN
INTRAMUSCULAR | Status: DC | PRN
  Administered 2022-04-29 (×2): 5 mg via INTRAVENOUS

## 2022-04-29 MED ORDER — ACETAMINOPHEN 500 MG PO TABS
1000.0000 mg | ORAL_TABLET | Freq: Once | ORAL | Status: AC
  Administered 2022-04-29: 1000 mg via ORAL
  Filled 2022-04-29: qty 2

## 2022-04-29 MED ORDER — FENTANYL CITRATE (PF) 250 MCG/5ML IJ SOLN
INTRAMUSCULAR | Status: AC
  Filled 2022-04-29: qty 5

## 2022-04-29 MED ORDER — AMISULPRIDE (ANTIEMETIC) 5 MG/2ML IV SOLN
10.0000 mg | Freq: Once | INTRAVENOUS | Status: AC
  Administered 2022-04-29: 10 mg via INTRAVENOUS

## 2022-04-29 MED ORDER — LACTATED RINGERS IV SOLN: INTRAVENOUS | Status: DC | PRN

## 2022-04-29 MED ORDER — PROPOFOL 10 MG/ML IV BOLUS
INTRAVENOUS | Status: AC
  Filled 2022-04-29: qty 20

## 2022-04-29 SURGICAL SUPPLY — 56 items
AID PSTN UNV HD RSTRNT DISP (MISCELLANEOUS) ×1
ALCOHOL 70% 16 OZ (MISCELLANEOUS) ×1 IMPLANT
ANCH SUT 2 SWLK 19.1 CLS EYLT (Anchor) ×2 IMPLANT
ANCH SUT FBRTK 1.3 2 TPE (Anchor) ×2 IMPLANT
ANCHOR FBRTK 2.6 SUTURETAP 1.3 (Anchor) IMPLANT
ANCHOR SWIVELOCK BIO 4.75X19.1 (Anchor) IMPLANT
BAG COUNTER SPONGE SURGICOUNT (BAG) ×1 IMPLANT
BAG SPNG CNTER NS LX DISP (BAG) ×1
BLADE EXCALIBUR 4.0X13 (MISCELLANEOUS) ×1 IMPLANT
BLADE SURG 11 STRL SS (BLADE) ×1 IMPLANT
DRAPE IMP U-DRAPE 54X76 (DRAPES) ×1 IMPLANT
DRAPE INCISE IOBAN 66X45 STRL (DRAPES) ×1 IMPLANT
DRAPE STERI 35X30 U-POUCH (DRAPES) ×1 IMPLANT
DRAPE U-SHAPE 47X51 STRL (DRAPES) ×2 IMPLANT
DRSG AQUACEL AG ADV 3.5X 4 (GAUZE/BANDAGES/DRESSINGS) ×1 IMPLANT
DRSG TEGADERM 4X4.5 CHG (GAUZE/BANDAGES/DRESSINGS) IMPLANT
DRSG XEROFORM 1X8 (GAUZE/BANDAGES/DRESSINGS) IMPLANT
DURAPREP 26ML APPLICATOR (WOUND CARE) ×1 IMPLANT
DW OUTFLOW CASSETTE/TUBE SET (MISCELLANEOUS) ×1 IMPLANT
ELECT REM PT RETURN 9FT ADLT (ELECTROSURGICAL) ×1
ELECTRODE REM PT RTRN 9FT ADLT (ELECTROSURGICAL) ×1 IMPLANT
GAUZE SPONGE 4X4 12PLY STRL (GAUZE/BANDAGES/DRESSINGS) IMPLANT
GAUZE XEROFORM 1X8 LF (GAUZE/BANDAGES/DRESSINGS) ×1 IMPLANT
GLOVE BIOGEL M 6.5 STRL (GLOVE) ×1 IMPLANT
GLOVE BIOGEL PI IND STRL 6.5 (GLOVE) ×1 IMPLANT
GLOVE BIOGEL PI IND STRL 8 (GLOVE) ×1 IMPLANT
GLOVE ECLIPSE 8.0 STRL XLNG CF (GLOVE) ×1 IMPLANT
GOWN STRL REUS W/ TWL LRG LVL3 (GOWN DISPOSABLE) ×3 IMPLANT
GOWN STRL REUS W/TWL LRG LVL3 (GOWN DISPOSABLE) ×3
KIT BASIN OR (CUSTOM PROCEDURE TRAY) ×1 IMPLANT
KIT TURNOVER KIT B (KITS) ×1 IMPLANT
MANIFOLD NEPTUNE II (INSTRUMENTS) ×1 IMPLANT
NDL HD SCORPION MEGA LOADER (NEEDLE) IMPLANT
NDL HYPO 25X1 1.5 SAFETY (NEEDLE) ×1 IMPLANT
NDL SPNL 18GX3.5 QUINCKE PK (NEEDLE) ×1 IMPLANT
NEEDLE HYPO 25X1 1.5 SAFETY (NEEDLE) ×1 IMPLANT
NEEDLE SPNL 18GX3.5 QUINCKE PK (NEEDLE) ×1 IMPLANT
NS IRRIG 1000ML POUR BTL (IV SOLUTION) ×1 IMPLANT
PACK SHOULDER (CUSTOM PROCEDURE TRAY) ×1 IMPLANT
PAD ARMBOARD 7.5X6 YLW CONV (MISCELLANEOUS) ×2 IMPLANT
PORT APPOLLO RF 90DEGREE MULTI (SURGICAL WAND) ×1 IMPLANT
RESTRAINT HEAD UNIVERSAL NS (MISCELLANEOUS) ×1 IMPLANT
SLING ARM FOAM STRAP LRG (SOFTGOODS) IMPLANT
SLING ARM IMMOBILIZER XL (CAST SUPPLIES) IMPLANT
SPONGE T-LAP 4X18 ~~LOC~~+RFID (SPONGE) ×1 IMPLANT
STRIP CLOSURE SKIN 1/2X4 (GAUZE/BANDAGES/DRESSINGS) IMPLANT
SUCTION FRAZIER HANDLE 10FR (MISCELLANEOUS)
SUCTION TUBE FRAZIER 10FR DISP (MISCELLANEOUS) IMPLANT
SUT 0 FIBERLOOP 38 BLUE TPR ND (SUTURE) ×5
SUT ETHILON 3 0 PS 1 (SUTURE) ×1 IMPLANT
SUT VICRYL 0 UR6 27IN ABS (SUTURE) IMPLANT
SUTURE 0 FIBERLP 38 BLU TPR ND (SUTURE) IMPLANT
TOWEL GREEN STERILE (TOWEL DISPOSABLE) ×1 IMPLANT
TOWEL GREEN STERILE FF (TOWEL DISPOSABLE) ×1 IMPLANT
TUBING ARTHROSCOPY IRRIG 16FT (MISCELLANEOUS) ×1 IMPLANT
WATER STERILE IRR 1000ML POUR (IV SOLUTION) ×1 IMPLANT

## 2022-04-29 NOTE — H&P (Signed)
Carlos Arnold is an 61 y.o. male.   Chief Complaint: Right shoulder pain HPI: Carlos Arnold is a 61 y.o. male who presents with right shoulder pain.  Does not take any medication for the pain.  He has been doing some working out in a exercise fellowship [employer] type situation.  This does involve push-ups and pull-ups.  Has been doing this for 7 years.  He has been modifying workout recently due to his right shoulder pain.  Golf is okay.  Working around the house is okay.  Trying to do much in terms of physical activity types of shoulder work does increase his symptoms.  Pain is a bigger component of his problem than weakness.  Occasionally the shoulder will wake him from sleep at night.  Does have a history of left shoulder rotator cuff tear repair done 12 years ago..   Past Medical History:  Diagnosis Date   Eczema    High cholesterol    Seasonal allergies     Past Surgical History:  Procedure Laterality Date   APPENDECTOMY     LASIK     NECK SURGERY  2012   SHOULDER SURGERY Left 2011   TONSILLECTOMY      Family History  Problem Relation Age of Onset   Colon cancer Neg Hx    Social History:  reports that he quit smoking about 40 years ago. His smoking use included cigarettes. He has never used smokeless tobacco. He reports current alcohol use of about 3.0 standard drinks of alcohol per week. He reports that he does not use drugs.  Allergies: No Known Allergies  Medications Prior to Admission  Medication Sig Dispense Refill   atorvastatin (LIPITOR) 40 MG tablet Take 40 mg by mouth daily.     calcium carbonate (TUMS - DOSED IN MG ELEMENTAL CALCIUM) 500 MG chewable tablet Chew 2 tablets by mouth daily as needed for indigestion or heartburn.     desonide (DESOWEN) 0.05 % ointment Apply 1 Application topically 2 (two) times daily as needed (eczema).     dupilumab (DUPIXENT) 300 MG/2ML prefilled syringe Inject 300 mg into the skin every 14 (fourteen) days.     fexofenadine  (ALLEGRA) 180 MG tablet Take 180 mg by mouth daily.     ibuprofen (ADVIL) 200 MG tablet Take 400 mg by mouth daily. For pain     Naproxen Sod-diphenhydrAMINE (ALEVE PM) 220-25 MG TABS Take 1-2 tablets by mouth at bedtime as needed (sleep).     Protein POWD Take 1 Scoop by mouth daily.     Testosterone 1.62 % GEL Apply 1 Pump topically daily.     triamcinolone ointment (KENALOG) 0.1 % Apply 1 Application topically 2 (two) times daily as needed (eczema).      Results for orders placed or performed during the hospital encounter of 04/28/22 (from the past 48 hour(s))  CBC     Status: None   Collection Time: 04/28/22 10:31 AM  Result Value Ref Range   WBC 6.4 4.0 - 10.5 K/uL   RBC 4.26 4.22 - 5.81 MIL/uL   Hemoglobin 13.8 13.0 - 17.0 g/dL   HCT 39.5 39.0 - 52.0 %   MCV 92.7 80.0 - 100.0 fL   MCH 32.4 26.0 - 34.0 pg   MCHC 34.9 30.0 - 36.0 g/dL   RDW 13.6 11.5 - 15.5 %   Platelets 233 150 - 400 K/uL   nRBC 0.0 0.0 - 0.2 %    Comment: Performed at [facility]  Hospital Lab, [place] [address] [place], [place] [address]  Basic metabolic panel     Status: Abnormal   Collection Time: 04/28/22 10:31 AM  Result Value Ref Range   Sodium 138 135 - 145 mmol/L   Potassium 4.6 3.5 - 5.1 mmol/L   Chloride 103 98 - 111 mmol/L   CO2 28 22 - 32 mmol/L   Glucose, Bld 114 (H) 70 - 99 mg/dL    Comment: Glucose reference range applies only to samples taken after fasting for at least 8 hours.   BUN 16 8 - 23 mg/dL   Creatinine, Ser 0.98 0.61 - 1.24 mg/dL   Calcium 9.4 8.9 - 10.3 mg/dL   GFR, Estimated >60 >60 mL/min    Comment: (NOTE) Calculated using the CKD-EPI Creatinine Equation (2021)    Anion gap 7 5 - 15    Comment: Performed at [facility] [address] [place], [place] [address]   No results found.  Review of Systems  Musculoskeletal:  Positive for arthralgias.  All other systems reviewed and are negative.   Blood pressure 135/76, pulse (!) 56, temperature 97.8 F (36.6 C),  temperature source Oral, resp. rate 11, height '5\' 10"'$  (1.778 m), weight 77.1 kg, SpO2 96 %. Physical Exam Vitals reviewed.  HENT:     Head: Normocephalic.     Nose: Nose normal.     Mouth/Throat:     Mouth: Mucous membranes are moist.  Eyes:     Pupils: Pupils are equal, round, and reactive to light.  Cardiovascular:     Rate and Rhythm: Normal rate.     Pulses: Normal pulses.  Pulmonary:     Effort: Pulmonary effort is normal.  Abdominal:     General: Abdomen is flat.  Musculoskeletal:     Cervical back: Normal range of motion.  Skin:    General: Skin is warm.     Capillary Refill: Capillary refill takes less than 2 seconds.  Neurological:     General: No focal deficit present.     Mental Status: He is alert.  Psychiatric:        Mood and Affect: Mood normal.     Ortho exam demonstrates some mild coarseness with internal/external rotation of the right arm at 90 degrees of abduction. Popeye deformity present. No discrete AC joint tenderness is present. No masses lymphadenopathy or skin changes noted in that shoulder region. Slight weakness to external rotation on the right compared to the left. Otherwise exam unchanged from prior visit  Assessment/Plan Impression is right shoulder rotator cuff tear with Popeye deformity and rupture of the biceps tendon. This is chronic. Not too much atrophy in that supraspinatus muscle belly. The tear is retracted just medial to the apex of the humeral head. May or may not be completely repairable in a waterproof fashion. Nonetheless I think to give him the best chance at avoiding reverse shoulder replacement in the future and attempt at rotator cuff tear repair should be considered. The risk and benefits of the procedure discussed with the patient include not limited to infection or vessel damage shoulder stiffness as well as incomplete restoration of function and incomplete restoration of a pain-free shoulder. I do think that [name] should modify his  activities moving forward after surgery. Anticipate at least partial repair of the posterior superior rotator cuff with surgical intervention. The biceps tendon is not fixable. Patient understands the risk and benefits and wishes to proceed. Extensive nature of the rehabilitative process is also  discussed. All questions answered   Anderson Malta, MD 04/29/2022, 1:38 PM

## 2022-04-29 NOTE — Brief Op Note (Signed)
   04/29/2022  5:07 PM  PATIENT:  Carlos Arnold  61 y.o. male  PRE-OPERATIVE DIAGNOSIS:  RIGHT MASSIVE SHOULDER ROTATOR CUFF TEAR  POST-OPERATIVE DIAGNOSIS:  RIGHT SHOULDER MASSIVE POSTERIOR SUPERIOR ROTATOR CUFF TEAR  PROCEDURE:  Procedure(s): RIGHT SHOULDER ARTHROSCOPY, DEBRIDEMENT, MINI OPEN ROTATOR CUFF REPAIR  SURGEON:  Surgeon(s): Marlou Sa, Tonna Corner, MD  ASSISTANT: Annie Main, PA  ANESTHESIA:   General  EBL: 25 ml    Total I/O In: 100 [IV Piggyback:100] Out: -   BLOOD ADMINISTERED: none  DRAINS: None  LOCAL MEDICATIONS USED: Vancomycin powder  SPECIMEN:  No Specimen  COUNTS:  YES  TOURNIQUET:  * No tourniquets in log *  DICTATION: .Other Dictation: Dictation Number [id number]  PLAN OF CARE: Discharge to home after PACU  PATIENT DISPOSITION:  PACU - hemodynamically stable

## 2022-04-29 NOTE — Op Note (Unsigned)
NAME: Carlos Arnold, Carlos Arnold MEDICAL RECORD NO: [id number] ACCOUNT NO: [id number] DATE OF BIRTH: 11-Apr-1961 FACILITY: [facility] LOCATION: [facility] PHYSICIAN: Yetta Barre. Marlou Sa, MD  Operative Report   DATE OF PROCEDURE: 04/29/2022  PREOPERATIVE DIAGNOSIS:  Right shoulder massive posterior superior rotator cuff tear.  POSTOPERATIVE DIAGNOSIS:  Right shoulder massive posterior superior rotator cuff tear.  PROCEDURE:  Right shoulder arthroscopy with massive posterior superior rotator cuff tear repair using Arthrex anchors.  SURGEON:  Yetta Barre. Marlou Sa, MD  ASSISTANT:  Annie Main, PA.  INDICATIONS:  This is a 61 year old patient who is very active and very fit.  He presents for operative management of right shoulder pain following explanation of risks and benefits.  MRI scan shows retracted rotator cuff tear with minimal atrophy in the  supraspinatus muscle belly.  DESCRIPTION OF PROCEDURE:  The patient was brought to the operating room where general anesthetic was induced.  Preoperative antibiotics administered.  Timeout was called.  The patient was placed in the beach chair position with head in neutral position.   Right shoulder, arm and hand prescrubbed with alcohol and Betadine, allowed to air dry, prepped with DuraPrep solution and draped in sterile manner.  Ioban used to seal the operative field and to cover the axilla.  Posterior portal was created.  A 2 cm  medial and inferior to the posterolateral margin of the acromion.  Diagnostic arthroscopy was performed.  The patient did have some focal chondromalacia at the superior aspect of the humeral head, measuring about 10% of the articular surface. The  glenohumeral articular surfaces were otherwise intact.  The patient had a massive rotator cuff tear, which was more A-shaped involving the infraspinatus and supraspinatus.  At this time, the instrument was removed from the portal, which was then closed  using 3-0 nylon.  Ioban then used to cover the  entire operative field. An incision off the anterolateral margin of the acromion was made.  Deltoid was split between the middle and anterior raphae, measured distance of 4 cm marked with #1 Vicryl suture.   The acromioplasty was performed.  The deltoid was split, bursectomy performed and acromioplasty performed.  Next, attention was directed towards the rotator cuff tear.  This was a primarily a A-shaped tear extending significantly medial to the apex of  the humeral head.  The landing zone was prepared.  At this time, 5-0 FiberWire sutures were placed with the Scorpion beginning medially and working laterally.  The medial two sutures were tied, which gave nice closure.  It should also be noted that  releases were performed of the coracohumeral ligament as well as inside the joint in order to facilitate mobilization of the tendon.  Next Vicryl sutures were placed at the leading edge closer to the tuberosity of the infraspinatus flap and supraspinatus  flap.  We were able to get the rotator cuff tendon reapproximated side-to-side with the remaining 3-0 Vicryl sutures.  These sutures were not tied yet, however.  Next, two Arthrex suture tape suture anchors were placed at the junction of the humeral  head and greater tuberosity. With the rotator cuff tendon side-to-side reduced using the traction sutures the suture tapes were placed posterior to anterior x8.  Then, the remaining three 0 FiberWire sutures were used to close the interval between the  supraspinatus, and infraspinatus.  Next, the rotator cuff tear was reduced back to the footprint and the suture tapes were tied and the limbs crossed.  The next was to match the suture tapes posteriorly with the  2 anterior flap Vicryls and match the  suture tapes anteriorly with the 2 posterior infraspinatus flap Vicryls.  This was done and placed into a SwiveLock, which was not placed into the bone yet.  The first pass was done with the middle 5-0 FiberWire sutures,  which were margin convergence  sutures.  They were placed into a SwiveLock right in the middle of the apex of the rotator cuff tear in line with the metaphysis of the bone.  Then, the suture tapes were placed in the SwiveLocks as well under appropriate tension with the arm adducted.   This gave a watertight repair.  Thorough irrigation was then performed.  The bony decompression was found to be adequate.  Next, the vancomycin powder was placed.  Deltoid split was repaired using #1 Vicryl suture followed by interrupted inverted 0  Vicryl suture, 2-0 Vicryl suture, and 3-0 Monocryl with Steri-Strips and impervious dressings applied.  The patient tolerated the procedure well without immediate complications.  Luke's assistance was required at all times for retraction, opening,  closing, mobilization of tissue.  His assistance was a medical necessity.   PUS D: 04/29/2022 5:14:58 pm T: 04/29/2022 7:08:00 pm  JOB: [id number]/ [id number]

## 2022-04-29 NOTE — Anesthesia Postprocedure Evaluation (Signed)
Anesthesia Post Note  Patient: Carlos Arnold  Procedure(s) Performed: RIGHT SHOULDER ARTHROSCOPY, DEBRIDEMENT, MINI OPEN ROTATOR CUFF REPAIR (Right: Shoulder)     Patient location during evaluation: PACU Anesthesia Type: General and Regional Level of consciousness: awake and alert Pain management: pain level controlled Vital Signs Assessment: post-procedure vital signs reviewed and stable Respiratory status: spontaneous breathing, nonlabored ventilation and respiratory function stable Cardiovascular status: blood pressure returned to baseline and stable Postop Assessment: no apparent nausea or vomiting Anesthetic complications: no  No notable events documented.  Last Vitals:  Vitals:   04/29/22 1725 04/29/22 1740  BP: 105/63 104/76  Pulse: 70 69  Resp: 15 18  Temp:  37.1 C  SpO2: 98% 100%    Last Pain:  Vitals:   04/29/22 1740  TempSrc:   PainSc: 0-No pain                 Gwendola Hornaday,W. EDMOND

## 2022-04-29 NOTE — Anesthesia Procedure Notes (Signed)
Anesthesia Regional Block: Interscalene brachial plexus block   Pre-Anesthetic Checklist: , timeout performed,  Correct Patient, Correct Site, Correct Laterality,  Correct Procedure, Correct Position, site marked,  Risks and benefits discussed,  Pre-op evaluation,  At surgeon's request and post-op pain management  Laterality: Right  Prep: Maximum Sterile Barrier Precautions used, chloraprep       Needles:  Injection technique: Single-shot  Needle Type: Echogenic Stimulator Needle     Needle Length: 5cm  Needle Gauge: 22     Additional Needles:   Procedures:, nerve stimulator,,, ultrasound used (permanent image in chart),,     Nerve Stimulator or Paresthesia:  Response: Biceps response  Additional Responses:   Narrative:  Start time: 04/29/2022 12:45 PM End time: 04/29/2022 12:55 PM Injection made incrementally with aspirations every 5 mL.  Performed by: Personally  Anesthesiologist: Roderic Palau, MD

## 2022-04-29 NOTE — Anesthesia Procedure Notes (Signed)
Procedure Name: MAC Date/Time: 04/29/2022 2:38 PM  Performed by: Mosetta Pigeon, CRNAPre-anesthesia Checklist: Patient identified Patient Re-evaluated:Patient Re-evaluated prior to induction Oxygen Delivery Method: Circle system utilized Preoxygenation: Pre-oxygenation with 100% oxygen Induction Type: IV induction Ventilation: Mask ventilation without difficulty Laryngoscope Size: Mac and 4 Grade View: Grade II Tube type: Oral Number of attempts: 1 Airway Equipment and Method: Stylet Placement Confirmation: ETT inserted through vocal cords under direct vision, positive ETCO2 and breath sounds checked- equal and bilateral Tube secured with: Tape Dental Injury: Teeth and Oropharynx as per pre-operative assessment

## 2022-04-29 NOTE — Transfer of Care (Signed)
Immediate Anesthesia Transfer of Care Note  Patient: Carlos Arnold  Procedure(s) Performed: RIGHT SHOULDER ARTHROSCOPY, DEBRIDEMENT, MINI OPEN ROTATOR CUFF REPAIR (Right: Shoulder)  Patient Location: PACU  Anesthesia Type:GA combined with regional for post-op pain  Level of Consciousness: awake, alert , and oriented  Airway & Oxygen Therapy: Patient Spontanous Breathing  Post-op Assessment: Report given to RN and Post -op Vital signs reviewed and stable  Post vital signs: Reviewed and stable  Last Vitals:  Vitals Value Taken Time  BP 106/63 04/29/22 1710  Temp 36.6 C 04/29/22 1710  Pulse 55 04/29/22 1711  Resp 13 04/29/22 1711  SpO2 98 % 04/29/22 1711  Vitals shown include unvalidated device data.  Last Pain:  Vitals:   04/29/22 1158  TempSrc:   PainSc: 2       Patients Stated Pain Goal: 0 (99/37/16 9678)  Complications: No notable events documented.

## 2022-04-29 NOTE — Anesthesia Preprocedure Evaluation (Addendum)
Anesthesia Evaluation  Patient identified by MRN, date of birth, ID band Patient awake    Reviewed: Allergy & Precautions, H&P , NPO status , Patient's Chart, lab work & pertinent test results  Airway Mallampati: II  TM Distance: >3 FB Neck ROM: Full    Dental no notable dental hx. (+) Teeth Intact, Dental Advisory Given   Pulmonary former smoker   Pulmonary exam normal breath sounds clear to auscultation       Cardiovascular negative cardio ROS  Rhythm:Regular Rate:Normal     Neuro/Psych negative neurological ROS  negative psych ROS   GI/Hepatic negative GI ROS, Neg liver ROS,,,  Endo/Other  negative endocrine ROS    Renal/GU negative Renal ROS  negative genitourinary   Musculoskeletal   Abdominal   Peds  Hematology negative hematology ROS (+)   Anesthesia Other Findings   Reproductive/Obstetrics negative OB ROS                             Anesthesia Physical Anesthesia Plan  ASA: 2  Anesthesia Plan: General   Post-op Pain Management: Regional block* and Tylenol PO (pre-op)*   Induction: Intravenous  PONV Risk Score and Plan: 2 and Ondansetron, Dexamethasone and Midazolam  Airway Management Planned: Oral ETT  Additional Equipment:   Intra-op Plan:   Post-operative Plan: Extubation in OR  Informed Consent: I have reviewed the patients History and Physical, chart, labs and discussed the procedure including the risks, benefits and alternatives for the proposed anesthesia with the patient or authorized representative who has indicated his/her understanding and acceptance.     Dental advisory given  Plan Discussed with: CRNA  Anesthesia Plan Comments:        Anesthesia Quick Evaluation

## 2022-04-30 ENCOUNTER — Telehealth: Payer: Self-pay | Admitting: Orthopedic Surgery

## 2022-04-30 ENCOUNTER — Encounter (HOSPITAL_COMMUNITY): Payer: Self-pay | Admitting: Orthopedic Surgery

## 2022-04-30 ENCOUNTER — Encounter: Payer: Self-pay | Admitting: Orthopedic Surgery

## 2022-04-30 NOTE — Telephone Encounter (Signed)
Pt wife called in with concerns about medication (oxyCODONE-acetaminophen (PERCOCET) 5-325 MG tablet) not working for pt... Pt wife stated that pt just got out of surgery and took pain medication.... Pt wife stated that the pain medication didn't make pt drowsy... Pt wife was wondering after the shoulder blocker wear off would the pain medication make the pt drowsy.... Pt wife stated that Dr. Marlou Sa advised them about send pt home with an aspirator... Pt wife stated that pt doesn't have one and would he be ok with the aspirator... Pt wife requesting callback

## 2022-04-30 NOTE — Telephone Encounter (Signed)
I called and talked to her.  Recommended that he take 1 to 2 tablets on a scheduled basis every 4 hours for the first 24 to 48 hours and then after that can back off some.

## 2022-04-30 NOTE — Telephone Encounter (Signed)
Dr Marlou Sa called around 445pm and talked with patients wife.

## 2022-05-01 ENCOUNTER — Telehealth: Payer: Self-pay | Admitting: Orthopedic Surgery

## 2022-05-01 ENCOUNTER — Other Ambulatory Visit: Payer: Self-pay | Admitting: Surgical

## 2022-05-01 MED ORDER — CELECOXIB 100 MG PO CAPS: 100.0000 mg | ORAL_CAPSULE | Freq: Two times a day (BID) | ORAL | 0 refills | Status: DC

## 2022-05-01 MED ORDER — HYDROMORPHONE HCL 2 MG PO TABS: 1.0000 mg | ORAL_TABLET | ORAL | 0 refills | Status: DC | PRN

## 2022-05-01 MED ORDER — GABAPENTIN 300 MG PO CAPS: 300.0000 mg | ORAL_CAPSULE | Freq: Three times a day (TID) | ORAL | 0 refills | Status: DC

## 2022-05-01 NOTE — Telephone Encounter (Signed)
Pt's wife Tammy called stating that they was told for pt to double up on pain medication. She states the pt states that the medication is doing nothing for his pain at all and been up all night. Pt's wife is asking maybe to change pain medication something else. Also pt's wife states was pt was suppose to receive a ice machine when he left hospital. She states she also sent a mychart message about these issues. Please call pt at [phone].

## 2022-05-02 ENCOUNTER — Telehealth: Payer: Self-pay | Admitting: Radiology

## 2022-05-02 ENCOUNTER — Telehealth: Payer: Self-pay

## 2022-05-02 DIAGNOSIS — Z01818 Encounter for other preprocedural examination: Secondary | ICD-10-CM

## 2022-05-02 NOTE — Telephone Encounter (Signed)
Received PA request for Hydromorphone 2pm tab from [facility]. I submitted on [email or web address] and medication and received notification that this was sent to [facility] for further review.  LMVM for pharmacy advising.  Key code [id number]

## 2022-05-02 NOTE — Telephone Encounter (Signed)
Documentation received by fax from [place] that Hydromorphone was denied by insurance. I called patient's wife, Lynelle Smoke, who states that she spoke with the pharmacy after medication was denied last night. They called the insurance company, and then used discount card, so patient was able to get Hydromorphone for $2.00.   Patient was asleep about 30 minutes after taking medication and is doing much better today. Per Tammy, nothing else needs to be done in regards to denial. She appreciates the office for going above and beyond for them. She would like it noted that Oxycodone does not work for him.

## 2022-05-08 ENCOUNTER — Ambulatory Visit (INDEPENDENT_AMBULATORY_CARE_PROVIDER_SITE_OTHER): Payer: BC Managed Care – PPO | Admitting: Surgical

## 2022-05-08 ENCOUNTER — Encounter: Payer: Self-pay | Admitting: Surgical

## 2022-05-08 ENCOUNTER — Other Ambulatory Visit: Payer: Self-pay | Admitting: Surgical

## 2022-05-08 DIAGNOSIS — M65911 Unspecified synovitis and tenosynovitis, right shoulder: Secondary | ICD-10-CM

## 2022-05-08 DIAGNOSIS — Z9889 Other specified postprocedural states: Secondary | ICD-10-CM

## 2022-05-08 DIAGNOSIS — M75121 Complete rotator cuff tear or rupture of right shoulder, not specified as traumatic: Secondary | ICD-10-CM

## 2022-05-08 DIAGNOSIS — M65811 Other synovitis and tenosynovitis, right shoulder: Secondary | ICD-10-CM

## 2022-05-08 MED ORDER — GABAPENTIN 300 MG PO CAPS: 300.0000 mg | ORAL_CAPSULE | Freq: Three times a day (TID) | ORAL | 0 refills | Status: DC

## 2022-05-08 NOTE — Progress Notes (Signed)
Post-Op Visit Note   Patient: Carlos Arnold           Date of Birth: 1961-04-04           MRN: [id number] Visit Date: 05/08/2022 PCP: Prince Solian, MD   Assessment & Plan:  Chief Complaint:  Chief Complaint  Patient presents with   Right Shoulder - Routine Post Op    R SHOULDER (surgery date 04-29-22)   Visit Diagnoses: No diagnosis found.  Plan: Carlos Arnold is a 61 y.o. male who presents s/p right shoulder rotator cuff repair on 04/29/2022.  Patient is doing well and pain is overall controlled.  Initially had a lot of difficulty with pain control and he states that oxycodone really did not provide any relief for him.  He subsequently received Dilaudid and gabapentin which provided great relief for the acute postop pain.  He has now weaned off of the Dilaudid and is not really taking any medications currently aside from occasional gabapentin, Robaxin, Celebrex.  on CPM machine.  Denies any chest pain, SOB, fevers, chills.  He is planning to wean off of the medications entirely.  Having occasional numbness and tingling in his right hand.  On exam, patient has range of motion 15 degrees X rotation, 80 degrees abduction, 80 degrees forward flexion..  Intact EPL, FPL, finger abduction, finger adduction, pronation/supination, bicep, tricep, deltoid of operative extremity.  Axillary nerve intact with deltoid firing.  Incisions are healing well without evidence of infection or dehiscence.  Sutures removed and replaced with Steri-Strips today.  2+ radial pulse of the operative extremity  Plan is continue with no lifting with the operative extremity.  No active range of motion of the right shoulder.  Keep using CPM machine and his shoulder is in a pretty good spot right now where he does not really feel overly stiff.  Refilled gabapentin today.  Plan to follow-up in 2 weeks for clinical recheck and initiation of physical therapy at that time.  Plan to take rehab fairly slow due to how  retracted rotator cuff tear was and had to be mobilized quite a bit.  Call with any concerns in the meantime but fortunately his pain is a lot better today and that was about a week ago..   Follow-Up Instructions: No follow-ups on file.   Orders:  No orders of the defined types were placed in this encounter.  No orders of the defined types were placed in this encounter.   Imaging: No results found.  PMFS History: There are no problems to display for this patient.  Past Medical History:  Diagnosis Date   Eczema    High cholesterol    Seasonal allergies     Family History  Problem Relation Age of Onset   Colon cancer Neg Hx     Past Surgical History:  Procedure Laterality Date   APPENDECTOMY     LASIK     NECK SURGERY  2012   SHOULDER ARTHROSCOPY WITH OPEN ROTATOR CUFF REPAIR Right 04/29/2022   Procedure: RIGHT SHOULDER ARTHROSCOPY, DEBRIDEMENT, MINI OPEN ROTATOR CUFF REPAIR;  Surgeon: Meredith Pel, MD;  Location: [place];  Service: Orthopedics;  Laterality: Right;   SHOULDER SURGERY Left 2011   TONSILLECTOMY     Social History   Occupational History   Not on file  Tobacco Use   Smoking status: Former    Types: Cigarettes    Quit date: 04/05/1982    Years since quitting: 40.1   Smokeless tobacco:  Never  Vaping Use   Vaping Use: Never used  Substance and Sexual Activity   Alcohol use: Yes    Alcohol/week: 3.0 standard drinks of alcohol    Types: 3 Glasses of wine per week   Drug use: No   Sexual activity: Not on file

## 2022-05-14 ENCOUNTER — Encounter: Payer: BC Managed Care – PPO | Admitting: Surgical

## 2022-05-15 ENCOUNTER — Encounter: Payer: BC Managed Care – PPO | Admitting: Internal Medicine

## 2022-05-28 ENCOUNTER — Other Ambulatory Visit: Payer: Self-pay | Admitting: Surgical

## 2022-05-28 ENCOUNTER — Ambulatory Visit (INDEPENDENT_AMBULATORY_CARE_PROVIDER_SITE_OTHER): Payer: BC Managed Care – PPO | Admitting: Orthopedic Surgery

## 2022-05-28 ENCOUNTER — Encounter: Payer: Self-pay | Admitting: Orthopedic Surgery

## 2022-05-28 DIAGNOSIS — Z9889 Other specified postprocedural states: Secondary | ICD-10-CM

## 2022-05-28 MED ORDER — TEMAZEPAM 15 MG PO CAPS: 15.0000 mg | ORAL_CAPSULE | Freq: Every evening | ORAL | 0 refills | Status: DC | PRN

## 2022-05-28 NOTE — Progress Notes (Signed)
   Post-Op Visit Note   Patient: Carlos Arnold           Date of Birth: January 03, 1961           MRN: [id number] Visit Date: 05/28/2022 PCP: Carlos Solian, MD   Assessment & Plan:  Chief Complaint:  Chief Complaint  Patient presents with   Right Shoulder - Routine Post Op    04/29/22 (4w 1d) Right Shoulder Arthroscopy, Debridement, Mini Open Rotator Cuff Repair     Visit Diagnoses:  1. S/P right rotator cuff repair     Plan: Carlos Arnold is a patient who is now about a month out right shoulder massive rotator cuff tear repair.  Sleeping is still a big issue for him.  Has not started physical therapy yet.  Get the 120 on the CPM machine.  On examination passive range of motion of the shoulder feels good with no crepitus or coarseness on palpation.  At this time would like to discontinue his sling for sleeping.  In 2 weeks he can discontinue the sling full-time.  I would like for him to start physical therapy next week for passive range of motion only for 2 weeks and then he can start some gentle strengthening and functional activities at this 6-week postop mark.  Restoril prescribed for sleep.  4-week return.  Did caution him to be extremely careful once he comes out of the sling full-time in terms of avoiding lifting things out in front of his body.  Follow-Up Instructions: No follow-ups on file.   Orders:  No orders of the defined types were placed in this encounter.  Meds ordered this encounter  Medications   temazepam (RESTORIL) 15 MG capsule    Sig: Take 1 capsule (15 mg total) by mouth at bedtime as needed for sleep.    Dispense:  30 capsule    Refill:  0    Imaging: No results found.  PMFS History: Patient Active Problem List   Diagnosis Date Noted   Synovitis of right shoulder 05/08/2022   Nontraumatic complete tear of right rotator cuff 05/08/2022   Past Medical History:  Diagnosis Date   Eczema    High cholesterol    Seasonal allergies     Family History   Problem Relation Age of Onset   Colon cancer Neg Hx     Past Surgical History:  Procedure Laterality Date   APPENDECTOMY     LASIK     NECK SURGERY  2012   SHOULDER ARTHROSCOPY WITH OPEN ROTATOR CUFF REPAIR Right 04/29/2022   Procedure: RIGHT SHOULDER ARTHROSCOPY, DEBRIDEMENT, MINI OPEN ROTATOR CUFF REPAIR;  Surgeon: Carlos Pel, MD;  Location: [place];  Service: Orthopedics;  Laterality: Right;   SHOULDER SURGERY Left 2011   TONSILLECTOMY     Social History   Occupational History   Not on file  Tobacco Use   Smoking status: Former    Types: Cigarettes    Quit date: 04/05/1982    Years since quitting: 40.1   Smokeless tobacco: Never  Vaping Use   Vaping Use: Never used  Substance and Sexual Activity   Alcohol use: Yes    Alcohol/week: 3.0 standard drinks of alcohol    Types: 3 Glasses of wine per week   Drug use: No   Sexual activity: Not on file

## 2022-06-19 ENCOUNTER — Encounter: Payer: BC Managed Care – PPO | Admitting: Internal Medicine

## 2022-07-02 ENCOUNTER — Ambulatory Visit (INDEPENDENT_AMBULATORY_CARE_PROVIDER_SITE_OTHER): Payer: BC Managed Care – PPO | Admitting: Orthopedic Surgery

## 2022-07-02 ENCOUNTER — Encounter: Payer: Self-pay | Admitting: Orthopedic Surgery

## 2022-07-02 DIAGNOSIS — Z9889 Other specified postprocedural states: Secondary | ICD-10-CM

## 2022-07-02 NOTE — Progress Notes (Signed)
   Post-Op Visit Note   Patient: Carlos Arnold           Date of Birth: 1960-09-25           MRN: [id number] Visit Date: 07/02/2022 PCP: Prince Solian, MD   Assessment & Plan:  Chief Complaint:  Chief Complaint  Patient presents with   Right Shoulder - Follow-up    04/29/22  Right Shoulder Arthroscopy, Debridement, Mini Open Rotator Cuff Repair     Visit Diagnoses:  1. S/P right rotator cuff repair     Plan: Carlos Arnold is a patient underwent massive rotator cuff tear repair on 04/29/2022.  Overall he is doing well.  Working with [employer] physical therapy.  On examination his range of motion is 30/90/140.  Rotator cuff strength improving.  At this time I think he is okay to continue to work toward strengthening.  He is doing 1 pound max on his back.  Pain is improving.  I would hold off on running at this point.  4-week return and increase in activity at that time.  Okay for putting and chipping but no real golf swing yet.  Follow-Up Instructions: No follow-ups on file.   Orders:  No orders of the defined types were placed in this encounter.  No orders of the defined types were placed in this encounter.   Imaging: No results found.  PMFS History: Patient Active Problem List   Diagnosis Date Noted   Synovitis of right shoulder 05/08/2022   Nontraumatic complete tear of right rotator cuff 05/08/2022   Past Medical History:  Diagnosis Date   Eczema    High cholesterol    Seasonal allergies     Family History  Problem Relation Age of Onset   Colon cancer Neg Hx     Past Surgical History:  Procedure Laterality Date   APPENDECTOMY     LASIK     NECK SURGERY  2012   SHOULDER ARTHROSCOPY WITH OPEN ROTATOR CUFF REPAIR Right 04/29/2022   Procedure: RIGHT SHOULDER ARTHROSCOPY, DEBRIDEMENT, MINI OPEN ROTATOR CUFF REPAIR;  Surgeon: Meredith Pel, MD;  Location: [place];  Service: Orthopedics;  Laterality: Right;   SHOULDER SURGERY Left 2011   TONSILLECTOMY      Social History   Occupational History   Not on file  Tobacco Use   Smoking status: Former    Types: Cigarettes    Quit date: 04/05/1982    Years since quitting: 40.2   Smokeless tobacco: Never  Vaping Use   Vaping Use: Never used  Substance and Sexual Activity   Alcohol use: Yes    Alcohol/week: 3.0 standard drinks of alcohol    Types: 3 Glasses of wine per week   Drug use: No   Sexual activity: Not on file

## 2022-07-21 ENCOUNTER — Encounter: Payer: Self-pay | Admitting: Internal Medicine

## 2022-07-25 ENCOUNTER — Encounter: Payer: Self-pay | Admitting: Orthopedic Surgery

## 2022-07-25 ENCOUNTER — Ambulatory Visit: Payer: BC Managed Care – PPO | Admitting: Orthopedic Surgery

## 2022-07-25 DIAGNOSIS — Z9889 Other specified postprocedural states: Secondary | ICD-10-CM

## 2022-07-25 NOTE — Progress Notes (Signed)
   Post-Op Visit Note   Patient: Carlos Arnold           Date of Birth: 26-Dec-1960           MRN: [id number] Visit Date: 07/25/2022 PCP: Prince Solian, MD   Assessment & Plan:  Chief Complaint:  Chief Complaint  Patient presents with   Right Shoulder - Follow-up     04/29/22  Right Shoulder Arthroscopy, Debridement, Mini Open Rotator Cuff Repair     Visit Diagnoses: No diagnosis found.  Plan: Carlos Arnold is a patient who is now 3 months out large right shoulder rotator cuff tear repair.  Overall he is doing well.  Doing some chipping and putting with golf.  No full swings yet.  Running is okay to do.  Has not really started with the bike yet.  On exam he has had pretty good range of motion of 45/100/170.  Also good rotator cuff strength 5- out of 5 on the right to external rotation as well as 5+ out of 5 subscap strength.  Plan at this time is gradual return to some activities.  I think he could start looking at a semifull golf swing in 4 weeks.  Bike is okay.  Cautioned him against a lot of free weight lifting.  Come back in 6 weeks for final check.  Follow-Up Instructions: No follow-ups on file.   Orders:  No orders of the defined types were placed in this encounter.  No orders of the defined types were placed in this encounter.   Imaging: No results found.  PMFS History: Patient Active Problem List   Diagnosis Date Noted   Synovitis of right shoulder 05/08/2022   Nontraumatic complete tear of right rotator cuff 05/08/2022   Past Medical History:  Diagnosis Date   Eczema    High cholesterol    Seasonal allergies     Family History  Problem Relation Age of Onset   Colon cancer Neg Hx     Past Surgical History:  Procedure Laterality Date   APPENDECTOMY     LASIK     NECK SURGERY  2012   SHOULDER ARTHROSCOPY WITH OPEN ROTATOR CUFF REPAIR Right 04/29/2022   Procedure: RIGHT SHOULDER ARTHROSCOPY, DEBRIDEMENT, MINI OPEN ROTATOR CUFF REPAIR;  Surgeon: Meredith Pel, MD;  Location: [place];  Service: Orthopedics;  Laterality: Right;   SHOULDER SURGERY Left 2011   TONSILLECTOMY     Social History   Occupational History   Not on file  Tobacco Use   Smoking status: Former    Types: Cigarettes    Quit date: 04/05/1982    Years since quitting: 40.3   Smokeless tobacco: Never  Vaping Use   Vaping Use: Never used  Substance and Sexual Activity   Alcohol use: Yes    Alcohol/week: 3.0 standard drinks of alcohol    Types: 3 Glasses of wine per week   Drug use: No   Sexual activity: Not on file

## 2022-09-08 ENCOUNTER — Ambulatory Visit (AMBULATORY_SURGERY_CENTER): Payer: BC Managed Care – PPO | Admitting: *Deleted

## 2022-09-08 ENCOUNTER — Encounter: Payer: Self-pay | Admitting: Internal Medicine

## 2022-09-08 VITALS — Ht 70.0 in | Wt 170.0 lb

## 2022-09-08 DIAGNOSIS — Z1211 Encounter for screening for malignant neoplasm of colon: Secondary | ICD-10-CM

## 2022-09-08 MED ORDER — NA SULFATE-K SULFATE-MG SULF 17.5-3.13-1.6 GM/177ML PO SOLN
1.0000 | Freq: Once | ORAL | 0 refills | Status: AC
Start: 2022-09-08 — End: 2022-09-08

## 2022-09-08 NOTE — Progress Notes (Signed)
No egg or soy allergy known to patient  No issues known to pt with past sedation with any surgeries or procedures Patient denies ever being told they had issues or difficulty with intubation  No FH of Malignant Hyperthermia Pt is not on diet pills Pt is not on  home 02  Pt is not on blood thinners  Pt denies issues with constipation  No A fib or A flutter Have any cardiac testing pending--NO Pt instructed to use Singlecare.com or GoodRx for a price reduction on prep    Patient's chart reviewed by Cathlyn Parsons CNRA prior to previsit and patient appropriate for the [facility].  Previsit completed and red dot placed by patient's name on their procedure day (on provider's schedule).     INDEPENDENT WITH MOBILITY

## 2022-09-10 ENCOUNTER — Ambulatory Visit: Payer: BC Managed Care – PPO | Admitting: Orthopedic Surgery

## 2022-09-10 DIAGNOSIS — Z9889 Other specified postprocedural states: Secondary | ICD-10-CM

## 2022-09-12 NOTE — Progress Notes (Unsigned)
Post-Op Visit Note   Patient: Carlos Arnold           Date of Birth: March 08, 1961           MRN: [id number] Visit Date: 09/10/2022 PCP: Chilton Greathouse, MD   Assessment & Plan:  Chief Complaint:  Chief Complaint  Patient presents with   Right Shoulder - Follow-up    04/29/22  Right Shoulder Arthroscopy, Debridement, Mini Open Rotator Cuff Repair        Visit Diagnoses:  1. S/P right rotator cuff repair     Plan: Carlos Arnold is a patient who is now about 4 months out from massive rotator cuff tear repair.  Overall he is done very well.  On exam his Forward flexion and abduction both above 90 degrees.  Rotator cuff strength is improving and is very good right now with infraspinatus supraspinatus and subscap muscle testing.  Plan at this time is to really be careful with activities.  The rotator cuff tear repair feels good at this time but I think that I do not want my getting too close to the red line in terms of shoulder activities.  He needs to be careful with any overhead weight lifting as well as lifting items out away from his body.  Also golf swings that are too hard could put a stretch on that posterior superior cuff which by necessity is already a little bit tight.  He will follow-up with [place] as needed.  Follow-Up Instructions: No follow-ups on file.   Orders:  No orders of the defined types were placed in this encounter.  No orders of the defined types were placed in this encounter.   Imaging: No results found.  PMFS History: Patient Active Problem List   Diagnosis Date Noted   Synovitis of right shoulder 05/08/2022   Nontraumatic complete tear of right rotator cuff 05/08/2022   Past Medical History:  Diagnosis Date   Allergy    SEASONAL   Arthritis    "MILD"   Cataract    "BEGINNING"   Eczema    High cholesterol    Seasonal allergies     Family History  Problem Relation Age of Onset   Colon cancer Neg Hx    Colon polyps Neg Hx    Crohn's disease Neg Hx     Esophageal cancer Neg Hx    Rectal cancer Neg Hx    Stomach cancer Neg Hx    Ulcerative colitis Neg Hx     Past Surgical History:  Procedure Laterality Date   APPENDECTOMY     LASIK     NECK SURGERY  2012   SHOULDER ARTHROSCOPY WITH OPEN ROTATOR CUFF REPAIR Right 04/29/2022   Procedure: RIGHT SHOULDER ARTHROSCOPY, DEBRIDEMENT, MINI OPEN ROTATOR CUFF REPAIR;  Surgeon: Cammy Copa, MD;  Location: [facility];  Service: Orthopedics;  Laterality: Right;   SHOULDER SURGERY Left 2011   SHOULDER SURGERY Left    2011,ROTATOR CUFF   TONSILLECTOMY     Social History   Occupational History   Not on file  Tobacco Use   Smoking status: Former    Types: Cigarettes    Quit date: 04/05/1982    Years since quitting: 40.4   Smokeless tobacco: Never  Vaping Use   Vaping Use: Never used  Substance and Sexual Activity   Alcohol use: Yes    Alcohol/week: 3.0 standard drinks of alcohol    Types: 3 Glasses of wine per week    Comment: OCC 3-4  WEEKS   Drug use: No   Sexual activity: Not on file

## 2022-09-13 ENCOUNTER — Encounter: Payer: Self-pay | Admitting: Orthopedic Surgery

## 2022-09-30 ENCOUNTER — Ambulatory Visit (AMBULATORY_SURGERY_CENTER): Payer: BC Managed Care – PPO | Admitting: Internal Medicine

## 2022-09-30 ENCOUNTER — Encounter: Payer: Self-pay | Admitting: Internal Medicine

## 2022-09-30 VITALS — BP 118/74 | HR 81 | Temp 98.6°F | Resp 13 | Ht 70.0 in | Wt 170.0 lb

## 2022-09-30 DIAGNOSIS — D123 Benign neoplasm of transverse colon: Secondary | ICD-10-CM

## 2022-09-30 DIAGNOSIS — Z1211 Encounter for screening for malignant neoplasm of colon: Secondary | ICD-10-CM

## 2022-09-30 DIAGNOSIS — K5140 Inflammatory polyps of colon without complications: Secondary | ICD-10-CM | POA: Diagnosis not present

## 2022-09-30 MED ORDER — SODIUM CHLORIDE 0.9 % IV SOLN
500.0000 mL | Freq: Once | INTRAVENOUS | Status: DC
Start: 2022-09-30 — End: 2022-09-30

## 2022-09-30 NOTE — Op Note (Signed)
[facility] Patient Name: Carlos Arnold Procedure Date: 09/30/2022 8:46 AM MRN: [id number] Endoscopist: Wilhemina Bonito. Marina Goodell , MD, [id number] Age: 62 Referring MD:  Date of Birth: 12/12/1960 Gender: Male Account #: [id number] Procedure:                Colonoscopy with cold snare polypectomy x 1 Indications:              Screening for colorectal malignant neoplasm.                            Negative index exam 2013 Medicines:                Monitored Anesthesia Care Procedure:                Pre-Anesthesia Assessment:                           - Prior to the procedure, a History and Physical                            was performed, and patient medications and                            allergies were reviewed. The patient's tolerance of                            previous anesthesia was also reviewed. The risks                            and benefits of the procedure and the sedation                            options and risks were discussed with the patient.                            All questions were answered, and informed consent                            was obtained. Prior Anticoagulants: The patient has                            taken no anticoagulant or antiplatelet agents.                            After reviewing the risks and benefits, the patient                            was deemed in satisfactory condition to undergo the                            procedure.                           After obtaining informed consent, the colonoscope  was passed under direct vision. Throughout the                            procedure, the patient's blood pressure, pulse, and                            oxygen saturations were monitored continuously. The                            CF [id number] #[id number] was introduced through the anus                            and advanced to the the cecum, identified by                            appendiceal orifice and  ileocecal valve. The                            ileocecal valve, appendiceal orifice, and rectum                            were photographed. The quality of the bowel                            preparation was excellent. The colonoscopy was                            performed without difficulty. The patient tolerated                            the procedure well. The bowel preparation used was                            SUPREP via split dose instruction. Scope In: 9:13:16 AM Scope Out: 9:23:50 AM Scope Withdrawal Time: 0 hours 8 minutes 1 second  Total Procedure Duration: 0 hours 10 minutes 34 seconds  Findings:                 A 3 mm polyp was found in the transverse colon. The                            polyp was removed with a cold snare. Resection and                            retrieval were complete.                           Multiple diverticula were found in the left colon.                           Internal hemorrhoids were found during                            retroflexion. The hemorrhoids were small.  The exam was otherwise without abnormality on                            direct and retroflexion views. Complications:            No immediate complications. Estimated blood loss:                            None. Estimated Blood Loss:     Estimated blood loss: none. Impression:               - One 3 mm polyp in the transverse colon, removed                            with a cold snare. Resected and retrieved.                           - Diverticulosis in the left colon.                           - Internal hemorrhoids.                           - The examination was otherwise normal on direct                            and retroflexion views. Recommendation:           - Repeat colonoscopy in 7-10 years for surveillance.                           - Patient has a contact number available for                            emergencies. The signs and  symptoms of potential                            delayed complications were discussed with the                            patient. Return to normal activities tomorrow.                            Written discharge instructions were provided to the                            patient.                           - Resume previous diet.                           - Continue present medications.                           - Await pathology results. Wilhemina Bonito. Marina Goodell, MD 09/30/2022 9:28:48 AM This report has been signed electronically.

## 2022-09-30 NOTE — Progress Notes (Signed)
HISTORY OF PRESENT ILLNESS:  Carlos Arnold is a 62 y.o. male presents today for screening colonoscopy.  Index exam 2013 was negative for neoplasia.  No current complaints  REVIEW OF SYSTEMS:  All non-GI ROS negative except for  Past Medical History:  Diagnosis Date   Allergy    SEASONAL   Arthritis    "MILD"   Cataract    "BEGINNING"   Eczema    High cholesterol    Seasonal allergies     Past Surgical History:  Procedure Laterality Date   APPENDECTOMY     LASIK     NECK SURGERY  2012   SHOULDER ARTHROSCOPY WITH OPEN ROTATOR CUFF REPAIR Right 04/29/2022   Procedure: RIGHT SHOULDER ARTHROSCOPY, DEBRIDEMENT, MINI OPEN ROTATOR CUFF REPAIR;  Surgeon: Cammy Copa, MD;  Location: [facility];  Service: Orthopedics;  Laterality: Right;   SHOULDER SURGERY Left 2011   SHOULDER SURGERY Left    2011,ROTATOR CUFF   TONSILLECTOMY      Social History Carlos Arnold  reports that he quit smoking about 40 years ago. His smoking use included cigarettes. He has never used smokeless tobacco. He reports current alcohol use of about 3.0 standard drinks of alcohol per week. He reports that he does not use drugs.  family history is not on file.  Allergies  Allergen Reactions   Oxycodone Other (See Comments)    No drug reaction, just did not provide any pain relief whatsoever after shoulder surgery in contrast to oral Dilaudid which did give good relief.  Was able to wean off medication within several days.   Solifenacin     Other Reaction(s): fatigue constipation ineffective       PHYSICAL EXAMINATION: Vital signs: BP (!) 109/53   Pulse 77   Temp 98.6 F (37 C) (Skin)   Ht 5\' 10"  (1.778 m)   Wt 170 lb (77.1 kg)   SpO2 100%   BMI 24.39 kg/m  General: Well-developed, well-nourished, no acute distress HEENT: Sclerae are anicteric, conjunctiva pink. Oral mucosa intact Lungs: Clear Heart: Regular Abdomen: soft, nontender, nondistended, no obvious ascites, no peritoneal  signs, normal bowel sounds. No organomegaly. Extremities: No edema Psychiatric: alert and oriented x3. Cooperative     ASSESSMENT:  Colon cancer screening   PLAN:  Screening colonoscopy

## 2022-09-30 NOTE — Progress Notes (Signed)
Pt's states no medical or surgical changes since previsit or office visit. 

## 2022-09-30 NOTE — Progress Notes (Signed)
Vss nad trans to pacu 

## 2022-09-30 NOTE — Patient Instructions (Signed)
Discharge instructions given. Handouts on polyps,diverticulosis and hemorrhoids. Resume previous medications. YOU HAD AN ENDOSCOPIC PROCEDURE TODAY AT THE [facility]:   Refer to the procedure report that was given to you for any specific questions about what was found during the examination.  If the procedure report does not answer your questions, please call your gastroenterologist to clarify.  If you requested that your care partner not be given the details of your procedure findings, then the procedure report has been included in a sealed envelope for you to review at your convenience later.  YOU SHOULD EXPECT: Some feelings of bloating in the abdomen. Passage of more gas than usual.  Walking can help get rid of the air that was put into your GI tract during the procedure and reduce the bloating. If you had a lower endoscopy (such as a colonoscopy or flexible sigmoidoscopy) you may notice spotting of blood in your stool or on the toilet paper. If you underwent a bowel prep for your procedure, you may not have a normal bowel movement for a few days.  Please Note:  You might notice some irritation and congestion in your nose or some drainage.  This is from the oxygen used during your procedure.  There is no need for concern and it should clear up in a day or so.  SYMPTOMS TO REPORT IMMEDIATELY:  Following lower endoscopy (colonoscopy or flexible sigmoidoscopy):  Excessive amounts of blood in the stool  Significant tenderness or worsening of abdominal pains  Swelling of the abdomen that is new, acute  Fever of 100F or higher   For urgent or emergent issues, a gastroenterologist can be reached at any hour by calling [phone]. Do not use MyChart messaging for urgent concerns.    DIET:  We do recommend a small meal at first, but then you may proceed to your regular diet.  Drink plenty of fluids but you should avoid alcoholic beverages for 24 hours.  ACTIVITY:  You should  plan to take it easy for the rest of today and you should NOT DRIVE or use heavy machinery until tomorrow (because of the sedation medicines used during the test).    FOLLOW UP: Our staff will call the number listed on your records the next business day following your procedure.  We will call around 7:15- 8:00 am to check on you and address any questions or concerns that you may have regarding the information given to you following your procedure. If we do not reach you, we will leave a message.     If any biopsies were taken you will be contacted by phone or by letter within the next 1-3 weeks.  Please call us at [phone] if you have not heard about the biopsies in 3 weeks.    SIGNATURES/CONFIDENTIALITY: You and/or your care partner have signed paperwork which will be entered into your electronic medical record.  These signatures attest to the fact that that the information above on your After Visit Summary has been reviewed and is understood.  Full responsibility of the confidentiality of this discharge information lies with you and/or your care-partner. 

## 2022-09-30 NOTE — Progress Notes (Signed)
Called to room to assist during endoscopic procedure.  Patient ID and intended procedure confirmed with present staff. Received instructions for my participation in the procedure from the performing physician.  

## 2022-10-01 ENCOUNTER — Telehealth: Payer: Self-pay

## 2022-10-01 NOTE — Telephone Encounter (Signed)
No answer, left message to call if having any issues or concerns, B.Charlean Carneal RN 

## 2022-10-03 ENCOUNTER — Encounter: Payer: Self-pay | Admitting: Internal Medicine

## 2022-12-18 IMAGING — US US ABDOMEN LIMITED
1 series · 14 of 25 positions shown · non-contrast
Comparison: None.

CLINICAL DATA: Abnormal liver enzyme

EXAM:
ULTRASOUND ABDOMEN LIMITED RIGHT UPPER QUADRANT

[Series 1: us abdomen limited · 0.15mm/px · 14 of 37 slices shown]
[im 1/37]
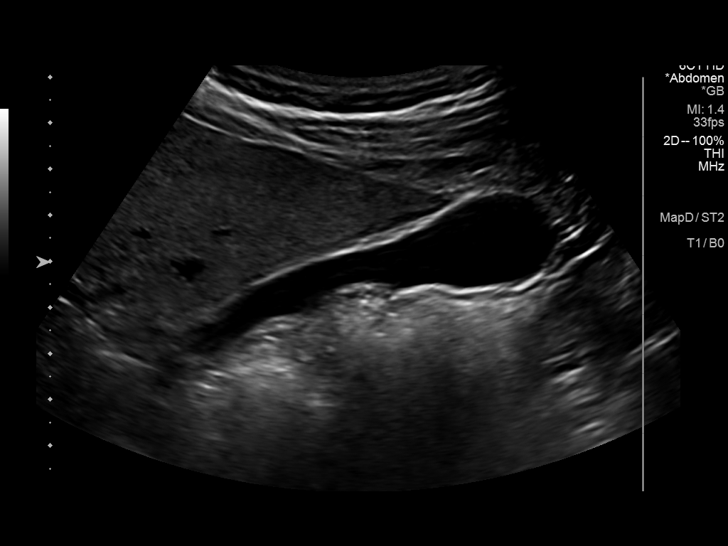
[im 4/37]
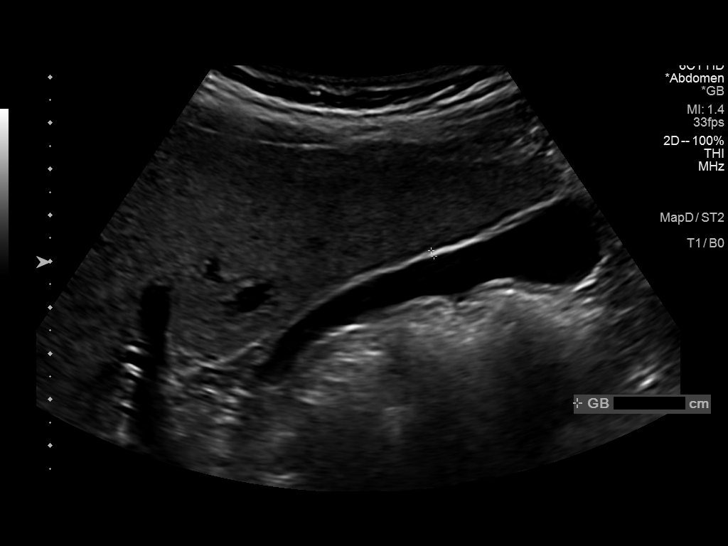
[im 7/37]
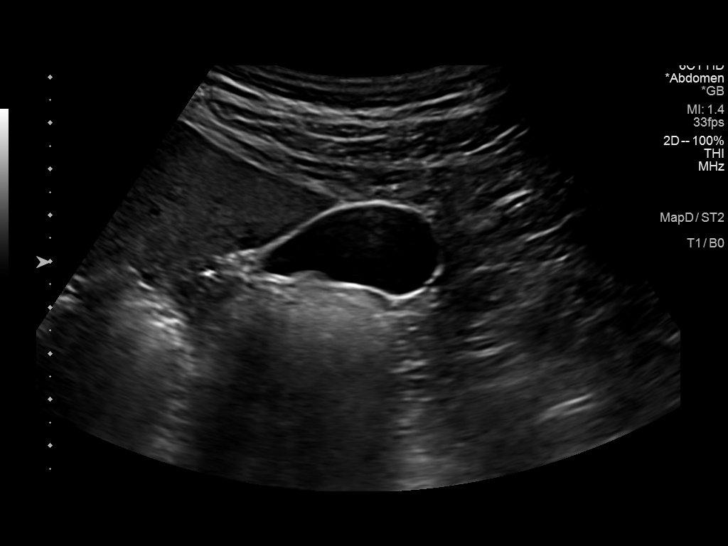
[im 10/37]
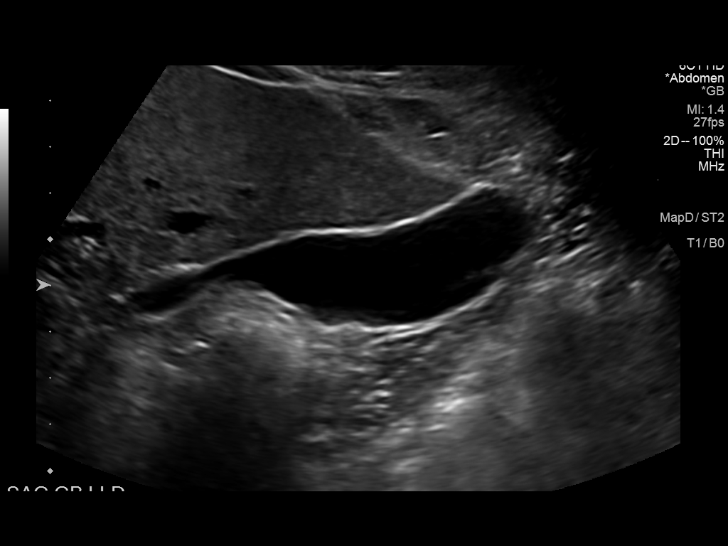
[im 13/37]
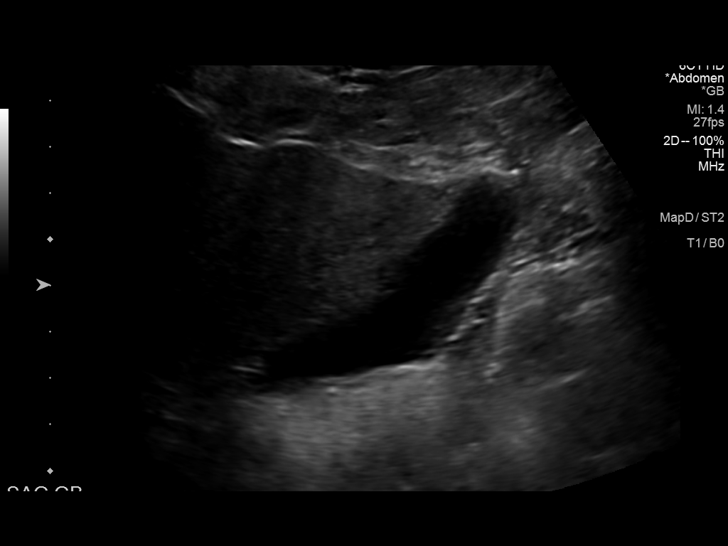
[im 14/37]
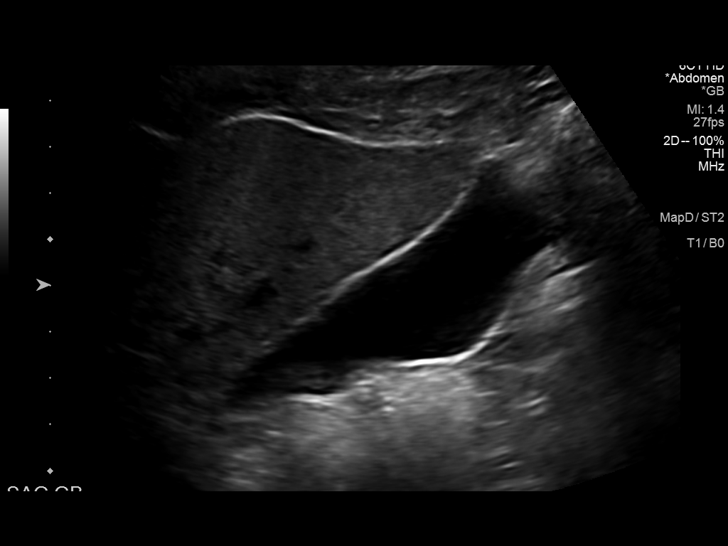
[im 17/37]
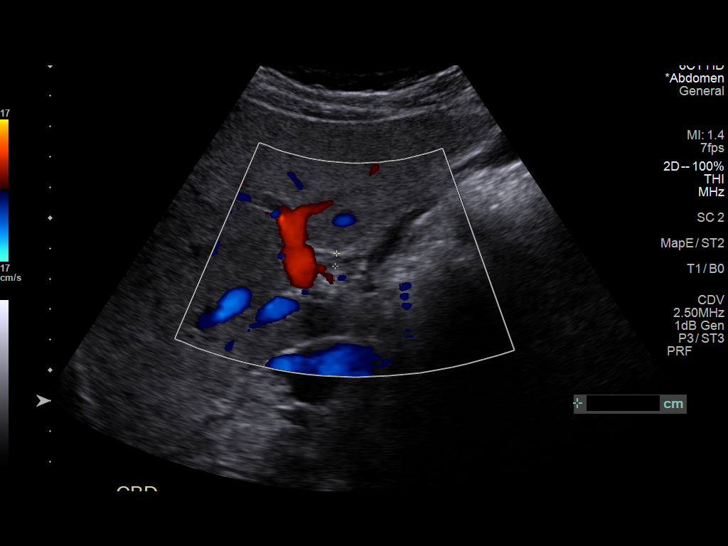
[im 20/37]
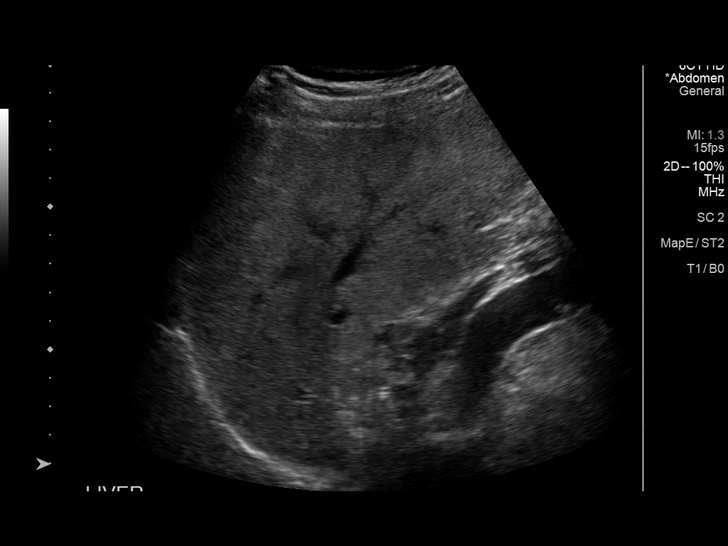
[im 23/37]
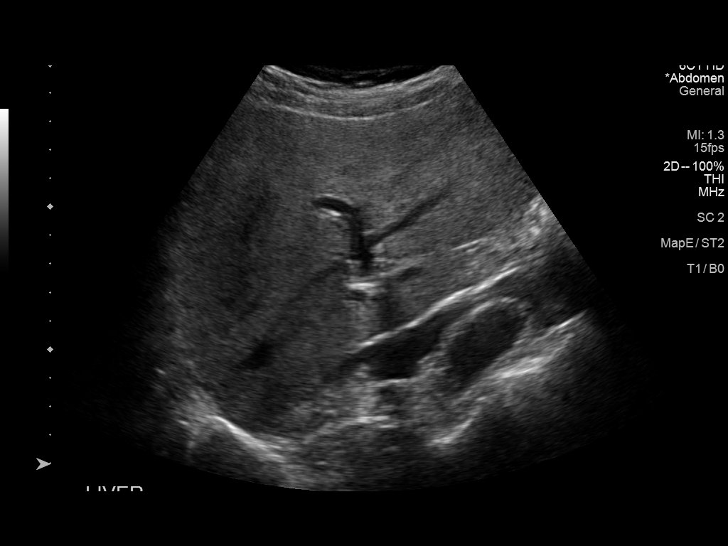
[im 25/37]
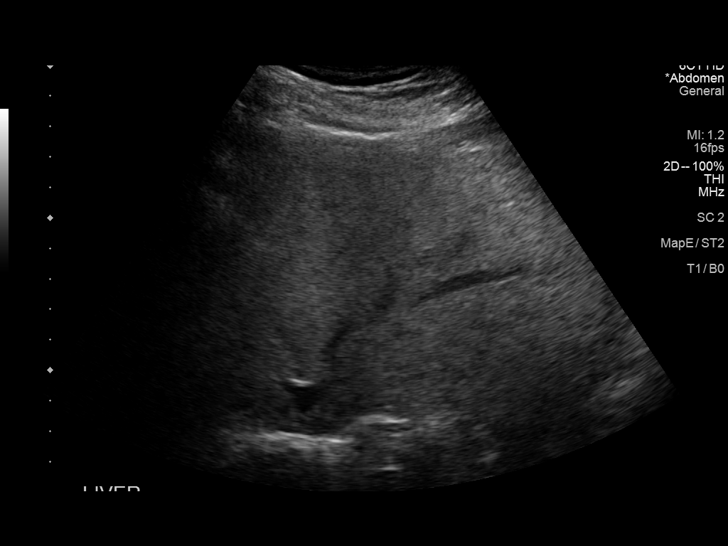
[im 28/37]
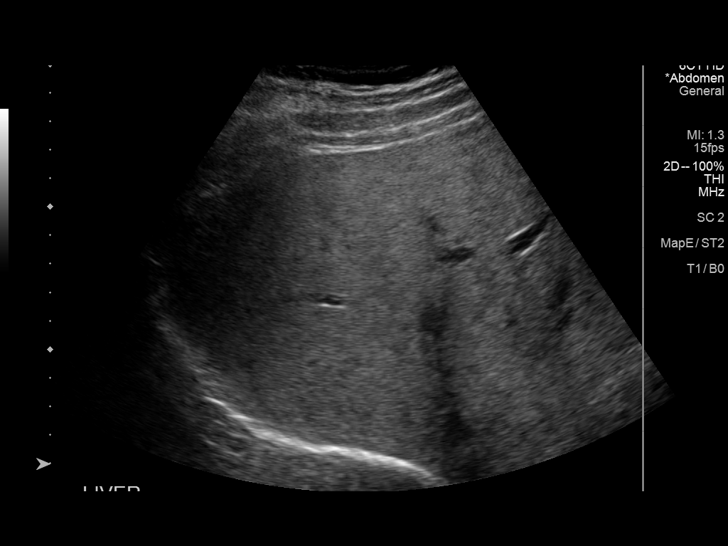
[im 31/37]
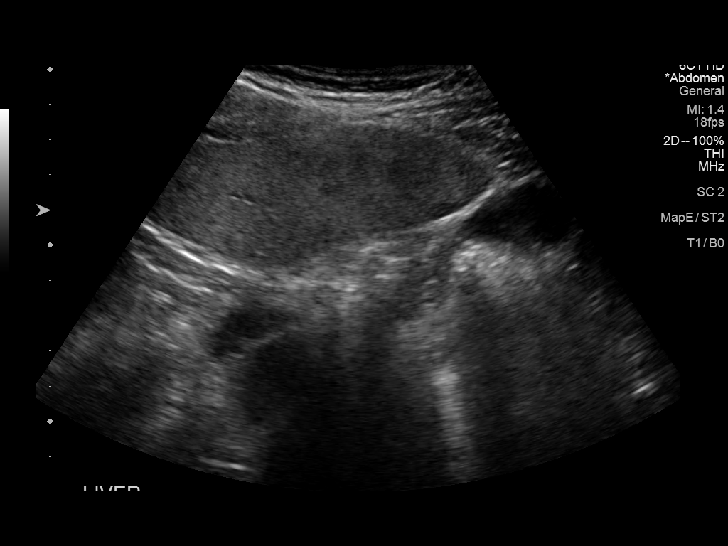
[im 34/37]
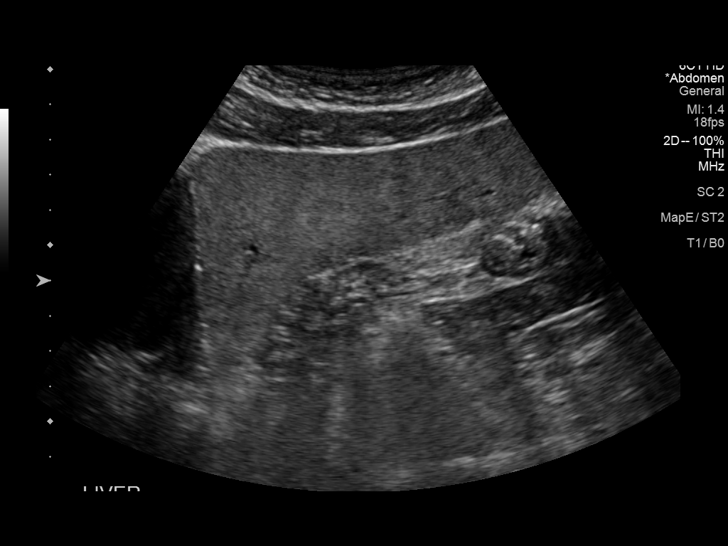
[im 37/37]
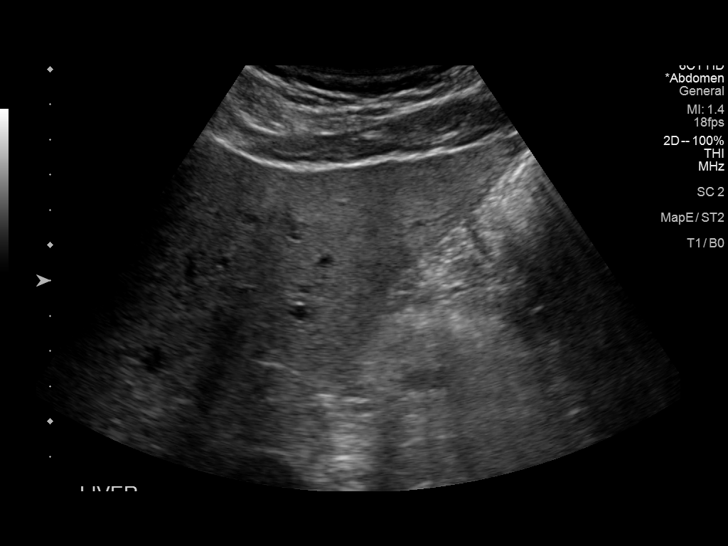

[14 of 25 positions shown; findings below may reference images not displayed]

FINDINGS: Gallbladder:

No gallstones or wall thickening visualized. No sonographic Murphy
sign noted by sonographer.

Common bile duct:

Diameter: 1.4 mm

Liver:

Heterogenous echogenic liver. No focal hepatic abnormality portal
vein is patent on color Doppler imaging with normal direction of
blood flow towards the liver.

Other: None.
IMPRESSION: Heterogenous echogenic liver consistent with hepatic steatosis and
or hepatocellular disease. Otherwise negative examination
